# Patient Record
Sex: Male | Born: 1960 | Race: White | Hispanic: No | Marital: Single | Smoking: Current every day smoker
Health system: Southern US, Community
[De-identification: ages and names within clinical notes are randomized; demographics above are authoritative.]

## PROBLEM LIST (undated history)

## (undated) DIAGNOSIS — F209 Schizophrenia, unspecified: Secondary | ICD-10-CM

## (undated) DIAGNOSIS — F1010 Alcohol abuse, uncomplicated: Secondary | ICD-10-CM

## (undated) DIAGNOSIS — I10 Essential (primary) hypertension: Secondary | ICD-10-CM

## (undated) DIAGNOSIS — J449 Chronic obstructive pulmonary disease, unspecified: Secondary | ICD-10-CM

## (undated) HISTORY — PX: COLONOSCOPY: SHX174

---

## 2013-07-30 ENCOUNTER — Emergency Department: Payer: Self-pay | Admitting: Emergency Medicine

## 2013-08-03 LAB — COMPREHENSIVE METABOLIC PANEL
Albumin: 4 g/dL (ref 3.4–5.0)
Anion Gap: 9 (ref 7–16)
BUN: 7 mg/dL (ref 7–18)
Bilirubin,Total: 0.6 mg/dL (ref 0.2–1.0)
Calcium, Total: 8.5 mg/dL (ref 8.5–10.1)
EGFR (African American): >60
EGFR (Non-African Amer.): >60
Glucose: 97 mg/dL (ref 65–99)
Potassium: 3.7 mmol/L (ref 3.5–5.1)
SGOT(AST): 41 U/L — ABNORMAL HIGH (ref 15–37)
SGPT (ALT): 31 U/L (ref 12–78)
Sodium: 142 mmol/L (ref 136–145)
Total Protein: 7.7 g/dL (ref 6.4–8.2)

## 2013-08-03 LAB — CBC
HCT: 46.6 % (ref 40.0–52.0)
HGB: 16.1 g/dL (ref 13.0–18.0)
MCH: 32.4 pg (ref 26.0–34.0)
MCHC: 34.5 g/dL (ref 32.0–36.0)
MCV: 94 fL (ref 80–100)
Platelet: 239 10*3/uL (ref 150–440)
RBC: 4.95 10*6/uL (ref 4.40–5.90)
WBC: 8.2 10*3/uL (ref 3.8–10.6)

## 2013-08-03 LAB — DRUG SCREEN, URINE
Amphetamines, Ur Screen: NEGATIVE (ref ?–1000)
Barbiturates, Ur Screen: NEGATIVE (ref ?–200)
Benzodiazepine, Ur Scrn: POSITIVE (ref ?–200)
Cocaine Metabolite,Ur ~~LOC~~: NEGATIVE (ref ?–300)
Methadone, Ur Screen: NEGATIVE (ref ?–300)
Opiate, Ur Screen: NEGATIVE (ref ?–300)
Tricyclic, Ur Screen: NEGATIVE (ref ?–1000)

## 2013-08-03 LAB — ACETAMINOPHEN LEVEL: Acetaminophen: <2 ug/mL

## 2013-08-03 LAB — URINALYSIS, COMPLETE
Bilirubin,UR: NEGATIVE
Blood: NEGATIVE
Glucose,UR: NEGATIVE mg/dL (ref 0–75)
Ketone: NEGATIVE
Leukocyte Esterase: NEGATIVE
Nitrite: NEGATIVE
Ph: 5 (ref 4.5–8.0)
Protein: 100
Specific Gravity: 1.013 (ref 1.003–1.030)

## 2013-08-03 LAB — ETHANOL: Ethanol %: 0.308 % (ref 0.000–0.080)

## 2013-08-03 LAB — SALICYLATE LEVEL: Salicylates, Serum: 1.8 mg/dL

## 2013-08-04 ENCOUNTER — Inpatient Hospital Stay: Payer: Self-pay | Admitting: Psychiatry

## 2013-09-21 ENCOUNTER — Emergency Department: Payer: Self-pay | Admitting: Emergency Medicine

## 2013-09-29 ENCOUNTER — Ambulatory Visit: Payer: Self-pay | Admitting: Chiropractor

## 2014-11-07 ENCOUNTER — Emergency Department: Payer: Self-pay | Admitting: Emergency Medicine

## 2014-11-22 ENCOUNTER — Inpatient Hospital Stay
Admit: 2014-11-22 | Disposition: A | Discharge status: Home / Self Care | Payer: Self-pay | Attending: Internal Medicine | Admitting: Internal Medicine

## 2014-11-25 DIAGNOSIS — I517 Cardiomegaly: Secondary | ICD-10-CM

## 2014-11-25 LAB — CBC WITH DIFFERENTIAL/PLATELET
Basophil #: 0.1 10*3/uL (ref 0.0–0.1)
Basophil %: 1 %
Eosinophil #: 0.2 10*3/uL (ref 0.0–0.7)
Eosinophil %: 2.1 %
HCT: 32.2 % — ABNORMAL LOW (ref 40.0–52.0)
HGB: 10.8 g/dL — ABNORMAL LOW (ref 13.0–18.0)
LYMPHS PCT: 14.8 %
Lymphocyte #: 1.4 10*3/uL (ref 1.0–3.6)
MCH: 32.3 pg (ref 26.0–34.0)
MCHC: 33.5 g/dL (ref 32.0–36.0)
MCV: 96 fL (ref 80–100)
MONOS PCT: 9 %
Monocyte #: 0.8 x10 3/mm (ref 0.2–1.0)
Neutrophil #: 6.7 10*3/uL — ABNORMAL HIGH (ref 1.4–6.5)
Neutrophil %: 73.1 %
PLATELETS: 306 10*3/uL (ref 150–440)
RBC: 3.35 10*6/uL — ABNORMAL LOW (ref 4.40–5.90)
RDW: 14.6 % — ABNORMAL HIGH (ref 11.5–14.5)
WBC: 9.2 10*3/uL (ref 3.8–10.6)

## 2014-11-25 LAB — HEMOGLOBIN A1C: Hemoglobin A1C: 5.6 % (ref 4.2–6.3)

## 2014-11-25 LAB — BASIC METABOLIC PANEL
ANION GAP: 7 (ref 7–16)
BUN: 5 mg/dL — ABNORMAL LOW
CHLORIDE: 101 mmol/L
Calcium, Total: 7.8 mg/dL — ABNORMAL LOW
Co2: 27 mmol/L
Creatinine: 0.57 mg/dL — ABNORMAL LOW
EGFR (African American): >60
GLUCOSE: 130 mg/dL — AB
POTASSIUM: 3 mmol/L — AB
Sodium: 135 mmol/L

## 2014-11-25 LAB — POTASSIUM: POTASSIUM: 3.4 mmol/L — AB

## 2014-11-25 LAB — MAGNESIUM
MAGNESIUM: 2.1 mg/dL
Magnesium: 1.6 mg/dL — ABNORMAL LOW

## 2014-11-25 LAB — PHOSPHORUS: PHOSPHORUS: 3.2 mg/dL

## 2014-11-26 LAB — POTASSIUM: Potassium: 3.5 mmol/L

## 2014-11-27 LAB — PLATELET COUNT: Platelet: 346 10*3/uL (ref 150–440)

## 2014-11-27 LAB — VANCOMYCIN, TROUGH: Vancomycin, Trough: 9 ug/mL — ABNORMAL LOW

## 2014-11-28 LAB — BASIC METABOLIC PANEL WITH GFR
Anion Gap: 9
BUN: 10 mg/dL
Calcium, Total: 8.3 mg/dL — ABNORMAL LOW
Chloride: 97 mmol/L — ABNORMAL LOW
Co2: 30 mmol/L
Creatinine: 0.57 mg/dL — ABNORMAL LOW
EGFR (African American): >60
EGFR (Non-African Amer.): >60
Glucose: 133 mg/dL — ABNORMAL HIGH
Potassium: 3.9 mmol/L
Sodium: 136 mmol/L

## 2014-11-28 LAB — CBC WITH DIFFERENTIAL/PLATELET
Basophil #: 0 10*3/uL (ref 0.0–0.1)
Basophil %: 0.3 %
Eosinophil #: 0 10*3/uL (ref 0.0–0.7)
Eosinophil %: 0 %
HCT: 34.8 % — AB (ref 40.0–52.0)
HGB: 11.4 g/dL — AB (ref 13.0–18.0)
LYMPHS PCT: 9.2 %
Lymphocyte #: 0.9 10*3/uL — ABNORMAL LOW (ref 1.0–3.6)
MCH: 31.9 pg (ref 26.0–34.0)
MCHC: 32.9 g/dL (ref 32.0–36.0)
MCV: 97 fL (ref 80–100)
MONOS PCT: 0.7 %
Monocyte #: 0.1 x10 3/mm — ABNORMAL LOW (ref 0.2–1.0)
NEUTROS ABS: 8.8 10*3/uL — AB (ref 1.4–6.5)
NEUTROS PCT: 89.8 %
Platelet: 432 10*3/uL (ref 150–440)
RBC: 3.59 10*6/uL — ABNORMAL LOW (ref 4.40–5.90)
RDW: 14.7 % — ABNORMAL HIGH (ref 11.5–14.5)
WBC: 9.8 10*3/uL (ref 3.8–10.6)

## 2014-11-28 LAB — EXPECTORATED SPUTUM ASSESSMENT W GRAM STAIN, RFLX TO RESP C

## 2014-11-29 LAB — BRONCHIAL WASH CULTURE

## 2014-12-01 LAB — COMPREHENSIVE METABOLIC PANEL
ALBUMIN: 2.5 g/dL — AB
ALT: 28 U/L
ANION GAP: 8 (ref 7–16)
Alkaline Phosphatase: 335 U/L — ABNORMAL HIGH
BILIRUBIN TOTAL: 1.3 mg/dL — AB
BUN: 10 mg/dL
CHLORIDE: 103 mmol/L
Calcium, Total: 8 mg/dL — ABNORMAL LOW
Co2: 28 mmol/L
Creatinine: 0.61 mg/dL
EGFR (African American): >60
EGFR (Non-African Amer.): >60
GLUCOSE: 103 mg/dL — AB
POTASSIUM: 3.5 mmol/L
SGOT(AST): 37 U/L
SODIUM: 139 mmol/L
Total Protein: 6 g/dL — ABNORMAL LOW

## 2014-12-01 LAB — CBC WITH DIFFERENTIAL/PLATELET
Basophil #: 0.1 10*3/uL (ref 0.0–0.1)
Basophil %: 1.4 %
EOS ABS: 0 10*3/uL (ref 0.0–0.7)
Eosinophil %: 0.4 %
HCT: 30.4 % — ABNORMAL LOW (ref 40.0–52.0)
HGB: 10.3 g/dL — ABNORMAL LOW (ref 13.0–18.0)
Lymphocyte #: 1.7 10*3/uL (ref 1.0–3.6)
Lymphocyte %: 19.1 %
MCH: 32 pg (ref 26.0–34.0)
MCHC: 33.9 g/dL (ref 32.0–36.0)
MCV: 95 fL (ref 80–100)
Monocyte #: 0.6 x10 3/mm (ref 0.2–1.0)
Monocyte %: 6.1 %
NEUTROS PCT: 73 %
Neutrophil #: 6.7 10*3/uL — ABNORMAL HIGH (ref 1.4–6.5)
PLATELETS: 396 10*3/uL (ref 150–440)
RBC: 3.21 10*6/uL — AB (ref 4.40–5.90)
RDW: 14.5 % (ref 11.5–14.5)
WBC: 9.1 10*3/uL (ref 3.8–10.6)

## 2014-12-02 LAB — BASIC METABOLIC PANEL
Anion Gap: 11 (ref 7–16)
BUN: 10 mg/dL
CHLORIDE: 103 mmol/L
CREATININE: 0.57 mg/dL — AB
Calcium, Total: 8.4 mg/dL — ABNORMAL LOW
Co2: 25 mmol/L
Glucose: 93 mg/dL
POTASSIUM: 3.6 mmol/L
SODIUM: 139 mmol/L

## 2014-12-02 LAB — CBC WITH DIFFERENTIAL/PLATELET
BASOS PCT: 1 %
Basophil #: 0.2 10*3/uL — ABNORMAL HIGH (ref 0.0–0.1)
EOS PCT: 0.2 %
Eosinophil #: 0 10*3/uL (ref 0.0–0.7)
HCT: 32.2 % — ABNORMAL LOW (ref 40.0–52.0)
HGB: 10.7 g/dL — AB (ref 13.0–18.0)
LYMPHS PCT: 15.3 %
Lymphocyte #: 2.5 10*3/uL (ref 1.0–3.6)
MCH: 31.6 pg (ref 26.0–34.0)
MCHC: 33.2 g/dL (ref 32.0–36.0)
MCV: 95 fL (ref 80–100)
MONO ABS: 0.9 x10 3/mm (ref 0.2–1.0)
MONOS PCT: 5.3 %
NEUTROS PCT: 78.2 %
Neutrophil #: 12.7 10*3/uL — ABNORMAL HIGH (ref 1.4–6.5)
PLATELETS: 405 10*3/uL (ref 150–440)
RBC: 3.38 10*6/uL — AB (ref 4.40–5.90)
RDW: 14.8 % — ABNORMAL HIGH (ref 11.5–14.5)
WBC: 16.2 10*3/uL — AB (ref 3.8–10.6)

## 2014-12-02 LAB — URINALYSIS, COMPLETE
BACTERIA: NONE SEEN
BILIRUBIN, UR: NEGATIVE
BLOOD: NEGATIVE
Glucose,UR: NEGATIVE mg/dL (ref 0–75)
LEUKOCYTE ESTERASE: NEGATIVE
Nitrite: NEGATIVE
Ph: 5 (ref 4.5–8.0)
Protein: 100
RBC,UR: 1 /HPF (ref 0–5)
SPECIFIC GRAVITY: 1.028 (ref 1.003–1.030)
Squamous Epithelial: <1

## 2014-12-04 LAB — BASIC METABOLIC PANEL
Anion Gap: 7 (ref 7–16)
BUN: 16 mg/dL
CHLORIDE: 103 mmol/L
CO2: 30 mmol/L
Calcium, Total: 8.5 mg/dL — ABNORMAL LOW
Creatinine: 0.65 mg/dL
EGFR (African American): >60
EGFR (Non-African Amer.): >60
Glucose: 98 mg/dL
POTASSIUM: 3.2 mmol/L — AB
Sodium: 140 mmol/L

## 2014-12-06 ENCOUNTER — Inpatient Hospital Stay
Admit: 2014-12-06 | Disposition: A | Discharge status: Home / Self Care | Payer: Self-pay | Attending: Internal Medicine | Admitting: Internal Medicine

## 2014-12-06 LAB — URINALYSIS, COMPLETE
Bacteria: NONE SEEN
Bilirubin,UR: NEGATIVE
Blood: NEGATIVE
GLUCOSE, UR: NEGATIVE mg/dL (ref 0–75)
KETONE: NEGATIVE
Leukocyte Esterase: NEGATIVE
Nitrite: NEGATIVE
Ph: 6 (ref 4.5–8.0)
Protein: 30
RBC,UR: <1 /HPF (ref 0–5)
SPECIFIC GRAVITY: 1.025 (ref 1.003–1.030)
Squamous Epithelial: NONE SEEN
WBC UR: 2 /HPF (ref 0–5)

## 2014-12-06 LAB — COMPREHENSIVE METABOLIC PANEL
ALK PHOS: 181 U/L — AB
ALT: 35 U/L
Albumin: 3.1 g/dL — ABNORMAL LOW
Anion Gap: 8 (ref 7–16)
BILIRUBIN TOTAL: 0.7 mg/dL
BUN: 12 mg/dL
CALCIUM: 8.6 mg/dL — AB
Chloride: 104 mmol/L
Co2: 27 mmol/L
Creatinine: 0.66 mg/dL
Glucose: 101 mg/dL — ABNORMAL HIGH
Potassium: 3.3 mmol/L — ABNORMAL LOW
SGOT(AST): 30 U/L
Sodium: 139 mmol/L
Total Protein: 6.6 g/dL

## 2014-12-06 LAB — ETHANOL

## 2014-12-06 LAB — PROTIME-INR
INR: 0.9
Prothrombin Time: 12.7 secs

## 2014-12-06 LAB — CBC
HCT: 32.4 % — ABNORMAL LOW (ref 40.0–52.0)
HGB: 10.9 g/dL — ABNORMAL LOW (ref 13.0–18.0)
MCH: 32.1 pg (ref 26.0–34.0)
MCHC: 33.5 g/dL (ref 32.0–36.0)
MCV: 96 fL (ref 80–100)
Platelet: 386 10*3/uL (ref 150–440)
RBC: 3.38 10*6/uL — AB (ref 4.40–5.90)
RDW: 15.7 % — ABNORMAL HIGH (ref 11.5–14.5)
WBC: 15.7 10*3/uL — ABNORMAL HIGH (ref 3.8–10.6)

## 2014-12-06 LAB — LACTIC ACID, PLASMA: LACTIC ACID, VENOUS: 1.2 mmol/L

## 2014-12-06 LAB — TROPONIN I: Troponin-I: <0.03 ng/mL

## 2014-12-06 LAB — CK TOTAL AND CKMB (NOT AT ARMC)
CK, Total: 17 U/L — ABNORMAL LOW
CK-MB: 1.2 ng/mL

## 2014-12-06 LAB — APTT: Activated PTT: 31.9 secs (ref 23.6–35.9)

## 2014-12-07 LAB — CBC WITH DIFFERENTIAL/PLATELET
BASOS ABS: 0.1 10*3/uL (ref 0.0–0.1)
BASOS PCT: 1 %
EOS ABS: 0.4 10*3/uL (ref 0.0–0.7)
Eosinophil %: 3.7 %
HCT: 32.1 % — AB (ref 40.0–52.0)
HGB: 10.4 g/dL — ABNORMAL LOW (ref 13.0–18.0)
LYMPHS ABS: 2.2 10*3/uL (ref 1.0–3.6)
LYMPHS PCT: 19.1 %
MCH: 31.3 pg (ref 26.0–34.0)
MCHC: 32.3 g/dL (ref 32.0–36.0)
MCV: 97 fL (ref 80–100)
Monocyte #: 0.7 x10 3/mm (ref 0.2–1.0)
Monocyte %: 6.1 %
Neutrophil #: 8.1 10*3/uL — ABNORMAL HIGH (ref 1.4–6.5)
Neutrophil %: 70.1 %
Platelet: 358 10*3/uL (ref 150–440)
RBC: 3.31 10*6/uL — ABNORMAL LOW (ref 4.40–5.90)
RDW: 15.2 % — ABNORMAL HIGH (ref 11.5–14.5)
WBC: 11.6 10*3/uL — ABNORMAL HIGH (ref 3.8–10.6)

## 2014-12-07 LAB — VANCOMYCIN, TROUGH: Vancomycin, Trough: 11 ug/mL

## 2014-12-07 LAB — BASIC METABOLIC PANEL
Anion Gap: 7 (ref 7–16)
BUN: 8 mg/dL
Calcium, Total: 7.9 mg/dL — ABNORMAL LOW
Chloride: 105 mmol/L
Co2: 26 mmol/L
Creatinine: 0.6 mg/dL — ABNORMAL LOW
EGFR (African American): >60
EGFR (Non-African Amer.): >60
Glucose: 106 mg/dL — ABNORMAL HIGH
Potassium: 3.3 mmol/L — ABNORMAL LOW
Sodium: 138 mmol/L

## 2014-12-11 LAB — CULTURE, BLOOD (SINGLE)

## 2014-12-24 NOTE — H&P (Signed)
PATIENT NAME:  Jack Lowe, Jack Lowe MR#:  [id number] DATE OF BIRTH:  05/26/1961  DATE OF ADMISSION:  08/04/2013 DATE OF EVALUATION: 08/05/2013  DATE OF DISCHARGE:  08/05/2013  REFERRING PHYSICIAN: Conni Slipper, MD ATTENDING PHYSICIAN: Ilena Dieckman B. Bary Leriche, MD  IDENTIFYING DATA: Mr. Advincula is a 54 year old male with episodic drinking.   CHIEF COMPLAINT: "I want to go home."   HISTORY OF PRESENT ILLNESS: Mr. Marchena is gainfully employed and in a relationship, but over the Thanksgiving holidays, he was drinking too much. When he started vomiting and his fiancee noticed some blood in his vomit, she brought him to the Emergency Room. His blood alcohol level was over 300. Psychiatry was asked for a consultation. Dr. Gretel Acre evaluated the patient in the Emergency Room, and she decided to put him on involuntary inpatient psychiatric commitment and admit to psychiatry. She started him on a CIWA protocol and prescribed standing doses of Librium. The following day, I met with the patient. He is cool and collected. He does not have any symptoms of alcohol withdrawal. He denies any symptoms of depression, anxiety, or psychosis. He denies, other than alcohol, substance use. 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 He is an episodic drinker but has never missed work because of drinking. He did go to one AA meeting in the area. He is originally from [place] but relocated here for work. He works with men who attend multiple [place] meetings, and they already discussed going to meetings together. He does not have a sponsor yet. He denies, other than alcohol, substance use. It is unclear why the patient was placed on involuntary commitment, but he reported symptoms of depression, hopelessness, helplessness. He denied suicidal ideation. He reported at the time of admission that he was unable to care for himself. Today, the patient states that when he  drinks, he gets depressed but never suicidal. He is sober now and again denies symptoms of depression and obviously is able to care for himself, as he is working.   PAST PSYCHIATRIC HISTORY: He has never been admitted to a psychiatric hospital, never seen a psychiatrist except for 2 detoxes that were done in [place] and [facility] in [place]. He also enrolled in a long-term rehab program but left AMA after 12 months when he was supposed to stay there for 18 months. Following this treatment, he maintained sobriety for 1 year.   FAMILY PSYCHIATRIC HISTORY: His uncle committed suicide. He was an alcoholic. His father was an alcoholic.   PAST MEDICAL HISTORY: None reported.   ALLERGIES: No known drug allergies.   MEDICATIONS ON ADMISSION: None.   SOCIAL HISTORY: The patient is divorced. He has 2 adult children. The 66 year old daughter has a baby, and the 63 year old lives with the mother. He did have a DUI in 2000. He is not in  legal trouble. He moved from [place] to [place] for work and works for [employer]. He lives here with a girlfriend who is supportive.   REVIEW OF SYSTEMS:    CONSTITUTIONAL: No fevers or chills. No weight changes.  EYES: No double or blurred vision.  ENT: No hearing loss.  RESPIRATORY: No shortness of breath or cough.  CARDIOVASCULAR: No chest pain or orthopnea.  GASTROINTESTINAL: No abdominal pain, nausea, vomiting, or diarrhea, but the patient did have some vomiting at the time of his initial visit to the Emergency Room. This has resolved.  GENITOURINARY:  No incontinence or frequency.  ENDOCRINE: No heat or cold intolerance.  LYMPHATIC: No anemia or easy bruising.  INTEGUMENTARY: No acne or rash.  MUSCULOSKELETAL: No muscle or joint pain.  NEUROLOGIC: No tingling or weakness.  PSYCHIATRIC: See history of present illness for details.   PHYSICAL EXAMINATION: VITAL SIGNS: Blood pressure 138/89, pulse 108, respirations 20, temperature 97.8.   GENERAL: This is a well-developed male in no acute distress.  HEENT: The pupils are equal, round, and reactive to light. Sclerae are anicteric.  NECK: Supple. No thyromegaly.  LUNGS: Clear to auscultation. No dullness to percussion.  HEART: Regular rhythm and rate. No murmurs, rubs, or gallops.  ABDOMEN: Soft, nontender, nondistended. Positive bowel sounds.  MUSCULOSKELETAL: Normal muscle strength in all extremities.  SKIN: No rashes or bruises.  LYMPHATIC: No cervical adenopathy.  NEUROLOGIC: Cranial nerves II through XII are intact.   LABORATORY DATA: Chemistries are within normal limits. Blood alcohol level is 0.308. LFTs within normal limits except for AST of 41. Urine tox screen positive for benzodiazepines. CBC within normal limits. Urinalysis is not suggestive of urinary tract infection. Serum acetaminophen and salicylates are low.   EKG: Sinus tachycardia, incomplete right branch block.   MENTAL STATUS EXAMINATION: The patient is alert and oriented to person, place, time, and situation. He is pleasant, polite, and cooperative. He is well groomed and casually dressed. He maintains good eye contact. His speech is of normal rhythm, rate, and volume. His mood is fine with full affect. Thought process is logical and goal oriented. Thought content: He denies suicidal or homicidal ideation. There are no delusions or paranoia. There are no auditory or visual hallucinations. His cognition is grossly intact. His insight and judgment are intact.   SUICIDE RISK ASSESSMENT: This is a patient with a history of alcoholism, who denies any symptoms of depression, anxiety, or psychosis, who is an episodic drinker and came to the hospital intoxicated. Even during intoxication, he did not voice any thoughts of hurting himself or others. He unfortunately is not interested in substance abuse treatment as a resident, as he has a job he would lose. He is ready to start outpatient program.   DIAGNOSES: AXIS I:  Alcohol dependence, alcohol intoxication, benzodiazepine abuse.  AXIS II: Deferred.  AXIS III: Alcoholic gastritis.  AXIS IV: Substance abuse.  AXIS V: Global Assessment of Functioning 55.   HOSPITAL COURSE:  The patient was admitted to [facility] for safety, stabilization, and medication management. He was initially placed on suicide precautions and was closely monitored for any unsafe behaviors. He underwent full psychiatric and risk assessment. He received pharmacotherapy, individual and group psychotherapy, substance abuse counseling, and support from therapeutic milieu.   1.  Alcohol detox: Admitting psychiatrist started the patient on CIWA protocol. He did not receive any Ativan per CIWA or Librium that was a standing order. He declined. He most likely does not require alcohol detox.  2.  Substance abuse treatment: The patient declines residential treatment. He is agreeable to follow up with [facility] program.   3.  Disposition: He will be discharged to home with his girlfriend. He will follow up with [facility].   MEDICATIONS AT THE TIME OF DISCHARGE: None.   ____________________________ Wardell Honour. Bary Leriche, MD jbp:jcm D: 08/05/2013 17:01:55 ET T: 08/05/2013 18:44:17 ET JOB#: [id number]  cc: Mackynzie Woolford B. Bary Leriche, MD, <Dictator> Clovis Fredrickson MD ELECTRONICALLY SIGNED 08/05/2013 20:18

## 2014-12-24 NOTE — Consult Note (Signed)
PATIENT NAME:  Jack Lowe, Jack Lowe MR#:  [id number] DATE OF BIRTH:  [birth year]  DATE OF CONSULTATION:  08/04/2013  REFERRING PHYSICIAN:  Dorothea GlassmanPaul Malinda, MD CONSULTING PHYSICIAN:  Ardeen FillersUzma S. Garnetta BuddyFaheem, MD  REASON FOR CONSULTATION: Alcohol intoxication.   HISTORY OF PRESENT ILLNESS: The patient is a 54 year old divorced male who presented to the ER intoxicated. His blood alcohol level was more than 300. He stayed in the ED and was started on medications. The patient reported during my interview that he is going through too much at this time. He has been drinking too much alcohol including a combination of beer and liquor for the past 3 days. He was brought to the hospital by the ambulance. The patient reported that he has been falling 1 to 2 times for the past couple of days. He reported that he currently lives by himself as he moved to [place] in June and is working through [employer]. The patient reported that he feels depressed, hopeless, and helpless at this time. He currently denied having any suicidal ideation or plan. He reported that he has been having shakes and has been passing out frequently. He reported that he is unable to care for himself. The patient appeared to have a flushed face at this time.   PAST PSYCHIATRIC HISTORY: The patient reported that he has never been admitted to a psychiatric hospital in the past. However, he has been through detox 1 to 2 times, in [place] and [place], [place]. He also completed a 1 year rehab program, but left out AMA as it was actually an 18 month rehabilitation program. He remained sober for approximately 1 year but relapsed after that. The patient reported that he does not use any other drugs or alcohol at this time. He smokes 1 pack of cigarettes per day.   MEDICAL HISTORY: The patient currently denied having any medical problems.   ALLERGIES: No known drug allergies.   FAMILY HISTORY: The patient reported that he has history of alcoholism in  his father.   CURRENT MEDICATIONS: None.   SOCIAL HISTORY: The patient is currently divorced. He has 2 children, ages 823 and [id number], and has good relationship with them. He has history of DWI in 2000. He denies any pending legal charges. He reported that he has a girlfriend who supports him. 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 He stated that his family lives in [place]. He has 2 brothers and 2 sisters.   REVIEW OF SYSTEMS: CONSTITUTIONAL: Denies any fever. Having weakness.  EYES: No double or blurred vision, but his eyes appeared red. His face appears flushed at this time.  ENT: No tinnitus, ear pain, or hearing loss.  RESPIRATORY: No cough or wheezing.  CARDIOVASCULAR: His pulse was elevated at this time. No palpitations or chest pain.  GASTROINTESTINAL: No nausea, vomiting, or diarrhea.  GENITOURINARY: No dysuria or hematuria.  ENDOCRINE: No polyuria or nocturia.  INTEGUMENTARY: No acne or rash.  NEUROLOGICAL: No numbness weakness noted.  VITAL SIGNS: Temperature 98.5, pulse 102, respirations 18, blood pressure 143/71.  LABORATORY DATA: Glucose 97, BUN 7, creatinine 0.70, sodium 142, potassium 3.7, chloride 105, bicarbonate 28, anion gap 9, osmolality 281, calcium 8.5. Blood alcohol level was 308 at the time of admission. Protein 7.7, albumin 4, bilirubin 0.6, alkaline phosphatase 116, AST 41, ALT 31. Amphetamines negative. UDS positive for benzodiazepines. WBC 8.2, hemoglobin 16.1, hematocrit 46.6, MCV 94, and RDW 15.8.  MENTAL STATUS EXAMINATION: The patient is a moderately built male who appeared with a flushed face. His speech was low in tone and volume. Mood was depressed and anxious. Affect was congruent. Thought process was logical. Thought content was nondelusional. He demonstrated poor insight and judgment regarding his alcohol use. He denied having any hallucinations at this time. His memory appeared intact.    DIAGNOSTIC IMPRESSION: AXIS I:  1.  Alcohol intoxication. 2.  Alcohol dependence.  3.  Alcohol-induced mood disorder.  AXIS II: None.  AXIS III: Alcohol use.   TREATMENT PLAN:  1.  The patient will be placed on involuntary commitment at this time as he appears a safety risk to himself as he has poor insight about his use of alcohol.  2.  He will be started on Librium 25 mg p.o. t.i.d. for the next 3 days.  3.  He will be referred to [facility] for alcohol detox and rehabilitation once he becomes medically stable.   Thank you for allowing me to participate in the care of this patient.  ____________________________ Ardeen Fillers. Garnetta Buddy, MD usf:sb D: 08/04/2013 10:51:10 ET T: 08/04/2013 11:29:22 ET JOB#: [id number]  cc: Ardeen Fillers. Garnetta Buddy, MD, <Dictator> Rhunette Croft MD ELECTRONICALLY SIGNED 08/06/2013 11:15

## 2015-01-02 NOTE — Discharge Summary (Signed)
PATIENT NAME:  Jack Lowe, Jack E MR#:  [id number] DATE OF BIRTH:  [birth year]  DATE OF ADMISSION:  11/22/2014 DATE OF DISCHARGE:  12/04/2014  Please refer to the interim summary already dictated 11/29/2013 by Dr.  Delfino LovettVipul Shah. This will cover from March 30 through April 02.   ADMISSION DIAGNOSIS: Delirium tremens with alcohol withdrawal and acute encephalopathy.   DISCHARGE DIAGNOSES: 1. Acute respiratory failure due to methicillin-susceptible Staphylococcus aureus pneumonia.  2. Delirium tremens, resolved.  3. Sinus tachycardia, resolved.  4. Nondisplaced avulsion fracture of the left greater tuberosity of the humerus.   CONSULTATIONS:  1. Dr. Belia HemanKasa.  2. Orthopedic, Dr. Welton FlakesKhan.  LABORATORIES AT DISCHARGE: Sodium 140, potassium 3.2, chloride 103, bicarbonate 30, BUN 16, creatinine 0.65, glucose 98.   DISCHARGE PHYSICAL EXAMINATION: VITAL SIGNS: Afebrile with temperature 98.6, pulse is 86, respirations 20, blood pressure 120/76 and 93% on room air.  GENERAL: The patient is alert, oriented. Not in acute distress.  CARDIOVASCULAR: Regular rate and rhythm. No murmurs, gallops or rubs. PMI was not displaced.  LUNGS: Clear to auscultation without crackles, rhonchi or wheezing. No dullness to percussion.  ABDOMEN: Bowel sounds present. Nontender, nondistended. No hepatosplenomegaly. EXTREMITIES: No clubbing, cyanosis, edema.   HOSPITAL COURSE: This is a 54 year old male with a history of alcohol abuse, who was admitted with hallucinations on 11/22/2014, found to have delirium tremens. For further details, please refer to the H and P as well as in the interim summary dictated on March 29.   1. Acute respiratory failure due to methicillin-susceptible Staphylococcus aureus pneumonia. The patient's sputum cultures were growing out methicillin-susceptible Staphylococcus aureus status post bronchoscopy. He was extubated on 11/27/2014. The patient is doing well from a respiratory standpoint. He no longer  requires oxygen. His chest x-ray did show improvement. His echocardiogram showed normal ejection fraction.  2. Delirium tremens. The patient was admitted with delirium tremens and hallucinations. He was in the ICU for several days. He was on Precedex. This obviously was weaned off and he was then transitioned to Valium which has been discontinued. He was initially placed on CIWA. He has gone through his withdrawals.  3. Nondisplaced avulsion fracture of the left greater tuberosity of humerus. Appreciate orthopedics input. He has a sling for comfort and pain medications p.r.n.  He will need follow-up with orthopedics for repeat imaging to insure healing.  4. Weakness. The patient will have home health at discharge.  5. Elevated liver function tests. An ultrasound of the abdomen was normal. He had elevated liver function tests due to alcohol.   DISCHARGE MEDICATIONS: 1. Metoprolol 25 mg b.i.d.  2. Nicotine patch 10 mg inhalation every 2 hours p.r.n. craving for nicotine.  3. Oxycodone 5 mg every 6 hours p.r.n., #30.   Discharge home health, physical therapy and nurse for assessment in gait.   DISCHARGE DIET: Regular diet.   DISCHARGE ACTIVITY: As tolerated.   DISCHARGE FOLLOWUP: The patient will need to follow up with his family MD in 2 weeks as well as orthopedic surgery.   The patient was stable for discharge.  TIME SPENT: 45 minutes    ____________________________ Zyanna Leisinger P. Juliene PinaMody, MD spm:TT D: 12/04/2014 13:51:22 ET T: 12/04/2014 15:23:43 ET JOB#: [id number]  cc: Tyri Elmore P. Juliene PinaMody, MD, <Dictator> Janyth ContesSITAL P Elliyah Liszewski MD ELECTRONICALLY SIGNED 12/04/2014 20:49

## 2015-01-02 NOTE — H&P (Signed)
PATIENT NAME:  Jack Lowe, BAGOT MR#:  [id number] DATE OF BIRTH:  16-Mar-1961  DATE OF ADMISSION:  11/22/2014  PRIMARY CARE PHYSICIAN:  Unknown.  CHIEF COMPLAINT: Brought in with hallucinations.   HISTORY OF PRESENT ILLNESS: This is a 54 year old man who is unable to give me any history whatsoever. As per the ER physician he was talking, mumbling his words, looked like he was going through withdrawal. I spoke with Tammy Apple who has the same home phone number as him.  The patient does live with her. She said he recently fractured his shoulder. The pain medications did not last. He needed to work so he started drinking alcohol for the past 2 weeks. He has not been eating very well. He stopped drinking a day or so ago, then started hallucinating.  Tammy tried to give him a few beers today to try to settle things down, but that did not help. Things got worse and was brought in for further evaluation.   When I saw the patient, I sternal rubbed him. He did moan and thrash around in the bed, but unable to give me any history.   PAST MEDICAL HISTORY: As per Tammy, included gastroesophageal reflux disease.   PAST SURGICAL HISTORY: None.   ALLERGIES: KETOROLAC.   MEDICATIONS: Include Prilosec on a daily basis.   SOCIAL HISTORY: Smokes 1 pack per day. Positive for alcohol. No drug use. Works in a Scientist, water quality.   FAMILY HISTORY: Father died of cancer. Mother, unknown medical history.  REVIEW OF SYSTEMS: Unable to obtain secondary to altered mental status.   PHYSICAL EXAMINATION: VITAL SIGNS: Temperature 98, pulse 107, respirations 24, blood pressure 163/87, pulse oximetry 92% on room air.  GENERAL: No respiratory distress, thrashing around in the bed.  EYES: Conjunctivae and lids normal. Pupils equal, round, and reactive to light. Unable to test extraocular muscles.  EARS, NOSE, MOUTH, AND THROAT: Nasal mucosa, no erythema. Throat, unable to be examined because he did not open his  mouth.  NECK: No JVD. No bruits. No lymphadenopathy. No thyromegaly. No thyroid nodules palpated.  RESPIRATORY:  Lungs clear to auscultation. No use of accessory muscles to breathe. No rhonchi, rales, or wheeze heard.  CARDIOVASCULAR: S1, S2 normal. No gallops, rubs, or murmurs heard.  ABDOMEN: Soft, nontender. No organomegaly/splenomegaly. Normoactive bowel sounds. No masses felt.  LYMPHATIC: No lymph nodes in the neck.  MUSCULOSKELETAL: No clubbing, edema, cyanosis.  SKIN: No ulcers or lesions seen.  NEUROLOGIC: Difficult to test, but he is moving all extremities on his own, just not purposely to my command.  PSYCHIATRIC: Unable to test at this time.   LABORATORY AND RADIOLOGICAL DATA: Urinalysis negative. Urine toxicology negative. Magnesium 1.8.  Salicylates less than 4, acetaminophen less than 10. Ethanol less than 5.  Glucose 101, BUN 10, creatinine 0.77, sodium 121, potassium 2.7, chloride 77, CO2 of 33, calcium 8.4, total bilirubin 2.1, alkaline phosphatase 239, AST 95, ALT 44. White blood cell count 9.2, H and H, 13.4 and 38.2, platelet count of 251,000.   ASSESSMENT AND PLAN: 1.  Delirium tremens with alcohol withdrawal and acute encephalopathy. At this point since he is unresponsive and unable to communicate, I have to admit him to the critical care unit for further monitoring. I will put on CIWA protocol, Precedex drip. Continue to monitor for worsening situation. This may get worse before it does get better.  2.  Hypokalemia. We will replace potassium intravenous and then intravenous fluids and check a magnesium, replace that if  low also.  3.  Hyponatremia, could be another reason for the patient's altered mental status, but likely this is secondary to the alcohol abuse. We will give intravenous fluid hydration and continue to monitor.  4.  Elevated liver function tests, likely secondary to alcohol abuse. Will check a hepatitis profile in the a.m. and an ultrasound of the abdomen and  recheck liver function tests after hydration.  TIME SPENT ON ADMISSION: 50 minutes. The patient will be admitted to the critical care unit.   ____________________________ Herschell Dimesichard J. Renae GlossWieting, MD rjw:LT D: 11/22/2014 17:14:26 ET T: 11/22/2014 17:30:43 ET JOB#: [id number]  cc: Herschell Dimesichard J. Renae GlossWieting, MD, <Dictator> Salley ScarletICHARD J Traci Gafford MD ELECTRONICALLY SIGNED 11/23/2014 17:30

## 2015-01-02 NOTE — H&P (Addendum)
PATIENT NAME:  Jack Lowe, Jack Lowe MR#:  [id number] DATE OF BIRTH:  November 29, 1960  DATE OF ADMISSION:  12/06/2014  PRIMARY DOCTOR: None.  EMERGENCY ROOM PHYSICIAN: Eryka A. Inocencio HomesGayle, MD  CHIEF COMPLAINT: Shortness of breath.   HISTORY OF PRESENT ILLNESS: The patient is a 54 year old male who was recently discharged on April 02 to home, come back because of shortness of breath, cough, and chest pain and left shoulder pain. The patient has been here from March 21 to April 2 and he has a significant hospital stay for MRSA pneumonia and acute respiratory failure requiring intubation. He also had delirium tremens and nondisplaced left greater tuberosity fracture of humerus at that time. The patient was discharged on 2nd and he went home, and the patient felt short of breath with cough and pleuritic chest pain. The patient found to have a new lingular pneumonia with elevated white count 15.7. He also was tachycardic, heart rate up to 117 and tachypneic. Concerning this, the patient is called for admission. The patient is upset because he wanted to go out and smoke and he complains of hoarseness of the voice that started after extubation.   PAST MEDICAL HISTORY: Significant for: 1.  GERD. 2.  A history of heavy alcohol abuse. 3.  Recent hospital stay for MRSA pneumonia, had bronchoscopy.   PAST SURGICAL HISTORY: None.   ALLERGIES: TORADOL.   SOCIAL HISTORY: Smokes 1 pack per day. No plans to quit. Alcohol abuse and the patient does not binge drink, and plans to quit.   FAMILY HISTORY: Father had cancer.   MEDICATIONS: Include: 1.  Metoprolol tartrate 25 mg p.o. b.i.d.  2.  Nicotine 10 mg 1 puff every 2 hours for nicotine craving. 3.  Oxycodone 5 mg every 6 hours as needed for pain.    REVIEW OF SYSTEMS: CONSTITUTIONAL: Has hoarseness of voice. No fever. Does have some trouble breathing.  EYES: No blurred vision.  ENT: No tinnitus. No epistaxis. No  turbinate hypertrophy,no oropharyngeal  erythema RESPIRATORY: Does have some cough, shortness of breath. No using accessory muscles of respiration.  CARDIOVASCULAR: Pleuritic chest pain present. No orthopnea or pedal edema.  GASTROINTESTINAL: No nausea. No vomiting. No abdominal pain.  GENITOURINARY: No dysuria.  ENDOCRINE: No polyuria or nocturia.  HEMATOLOGIC: No anemia or easy bruising.  INTEGUMENTARY: No skin rashes.  MUSCULOSKELETAL: Has left shoulder sling present. Has nondisplaced humerus fracture.  PSYCHIATRIC: Does have anxiety.  PHYSICAL EXAMINATION:  VITAL SIGNS: Temperature 98.2, heart rate 117, blood pressure 150/90, saturation is 96% on room air.  GENERAL: He is alert, awake, oriented, well-developed, well-nourished male slightly in distress because of left shoulder pain and also wanting to smoke cigarette.  HEAD: Normocephalic, atraumatic.  EYES: Pupils equal, reacting to light. No conjunctival pallor. No icterus. Extraocular movements intact. NOSE: No nasal lesions. No drainage.  EARS: No drainage. No external lesions. MOUTH: No lesions noted.  NECK: Supple. No JVD. No carotid bruit. Normal range of motion without pain.  RESPIRATORY: Bilateral breath sounds present, clear to auscultation. No wheeze. No rales.  CARDIOVASCULAR: S1, S2 regular, tachycardic. The patient's pulses equal at carotid, femoral, dorsalis. No peripheral edema.  ABDOMEN: Soft, nontender, nondistended. Bowel sounds present.  MUSCULOSKELETAL: The patient does have left shoulder sling due to a greater tuberosity fracture.  SKIN: Inspection is normal.  VASCULAR: Good pedal pulse.  NEUROLOGIC: Cranial nerves II-XII intact. DTR's 2+ bilaterally. Power of lower extremities 5/5, and right upper extremity 5/5. Strength is not checked on the left upper extremity because  of nondisplaced fracture. PSYCHIATRIC: The patient is anxious and agitated and wanting to smoke.   LABORATORY DATA: WBC 15.7, hemoglobin 10.9, hematocrit 32.4, platelets 86,000.   Electrolytes: Sodium 139, potassium 3.3, chloride 104, bicarbonate 27, BUN 12, creatinine 0.68, glucose 101.  Chest x-ray shows lingular pneumonia. UA is clear. EKG shows sinus tachycardia with 116 beats per minute. No ST-T changes.   ASSESSMENT AND PLAN:  14.  A 54 year old male with healthcare-associated pneumonia with elevated white count, tachypnea, tachycardia, and also with a white count as I mentioned. The patient is admitted to medical service. Start him on vancomycin and Zosyn and oxygen and follow cultures.  2.  Left shoulder pain secondary to greater tuberosity fracture, nondisplaced, seen by ortho last admission and sling is there for comport. Continue pain medicine with tramadol and oxycodone as needed. The patient needs to follow up with them after discharge to ensure healing.  3.  The patient has history of EtOH abuse now, not in delirium tremens.  4.  History of tobacco abuse. The patient will get Nicotrol Inhaler.   TIME SPENT: Fifty-five minutes.    ____________________________ Katha Hamming, MD sk:TM D: 12/06/2014 19:36:38 ET T: 12/06/2014 20:27:27 ET JOB#: [id number]  cc: Katha Hamming, MD, <Dictator> Katha Hamming MD ELECTRONICALLY SIGNED 01/04/2015 12:57

## 2015-01-02 NOTE — Consult Note (Signed)
Brief Consult Note: Diagnosis: L shoulder Greater tuberosity fracture.   Comments: Jack Lowe admitted to ICU for ETOH withdrawal with respiratory failure, DTs, extubated today. L shoulder pain noted previously on admission, and shoulder xrays obtained. Patient was nonverbal on my exam, but did nod occasionally to questions. He affirmed L shoulder pain.   PE:  Awake, difficult to direct TTP over L GT Able to passively abduct shoulder and passively forward flex shoulder 2/5 elbow flexion, 3/5 grip strength Unable to assess sensation BCR  Xray R shoulder - no fracture or dislocation Xray L shoulder - Nondisplaced greater tuberosity fracture  Impression - nondisplaced fracture of greater tuberosity Plan: Sling for comfort Elbow and wrist motion encouraged, sling removal daily for motion when able No active shoulder flexion or external rotation followup with Dr. Lutricia HorsfallHooton after discharge for repeat imaging to ensure healing please reconsult with questions.  Electronic Signatures: Andee PolesKahn, Infinity Jeffords D (MD)  (Signed 26-Mar-16 22:07)  Authored: Brief Consult Note   Last Updated: 26-Mar-16 22:07 by Deatra JamesKahn, Leone Mobley D (MD)

## 2015-01-02 NOTE — Discharge Summary (Signed)
PATIENT NAME:  Jack Lowe, Jack E MR#:  [id number] DATE OF BIRTH:  [birth year]  DATE OF ADMISSION:  12/06/2014 DATE OF DISCHARGE:  12/08/2014  PRIMARY CARE PHYSICIAN:  [facility].   FINAL DIAGNOSES: 1. Clinical sepsis.  2. Health care acquired pneumonia.  3. Chronic obstructive pulmonary disease exacerbation.  4. Tobacco abuse.  5. Recent fracture.  6. Recent admission for alcohol abuse and withdrawal and delirium tremens.   HOSPITAL COURSE: The patient was admitted 12/06/2014, and discharged 12/08/2014. The patient was admitted with shortness of breath, elevated white count, was started on aggressive antibiotics. Laboratory and radiological data during the hospital course included blood cultures that were negative. Ethanol less than 5, glucose 101, BUN 12, creatinine 0.66, sodium 139, potassium 3.3, chloride 104, CO2 of 27, calcium 8.6. Liver function tests: Total bilirubin 0.7, alkaline phosphatase 181, ALT 35, AST 30, total protein 6.6, albumin 3.1. White blood cell count 15.7, hemoglobin and hematocrit 10.9 and 32.4, platelet count of 386,000. CPK 17. PT, INR, and PTT normal range. Lactic acid 1.2. Troponin negative. EKG: Sinus tachycardia. Chest x-ray showed lingular pneumonia. Urinalysis negative. White count upon discharge 11.6.   HOSPITAL COURSE PER PROBLEM LIST:  1. Clinical sepsis with leukocytosis and tachycardia.  Pneumonia seen on chest x-ray. This was present on admission.  The patient was started on aggressive antibiotics.  2. Healthcare acquired pneumonia. The patient had a recent hospitalization and was treated for H. flu and methicillin sensitive Staph aureus pneumonia during the hospital course.   I am unclear if this lingular pneumonia is new or previous from last time, but I will treat it anyway since he came in with respiratory symptoms. 3. COPD exacerbation.  When I saw the patient on April 5, he was wheezing. I think the issue here is he went home, started smoking, and  then came back with bronchospasm.  I started high-dose steroids.  Upon discharge, I will give a prednisone taper and I prescribed a Flovent and albuterol inhaler also. 4. Tobacco abuse.  Advised against smoking.  Nicotrol inhaler ordered.  5. Recent fracture. He is in a sling on his left arm. I did prescribe a small amount of Percocet. 6. Recent hospitalization with delirium tremens.  He was treating his fracture previously with alcohol, went into severe alcohol withdrawal during the last hospitalization requiring intubation, and then extubation. The patient had no signs of withdrawal on this hospitalization.   TIME SPENT ON DISCHARGE:  Thirty-five minutes.   MEDICATIONS ON DISCHARGE INCLUDED:  Percocet 5/325 one tablet every 6 hours as needed for moderate pain, metoprolol 25 mg twice a day, Levaquin 500 milligrams once a day for 8 more days, nicotine inhaler 1 inhalation every 1 to 2 hours, Flovent HFA 2 puffs twice a day, albuterol CFC 1 puff 4 times a day as needed for shortness breath, prednisone taper 5 mg 4 tablets day one, 3 tablets day two, 2 tablets day three, 1 tablet day four and five, then stop.     ____________________________ Herschell Dimesichard J. Renae GlossWieting, MD rjw:tr D: 12/08/2014 14:25:11 ET T: 12/08/2014 17:47:38 ET JOB#: [id number]  cc: Herschell Dimesichard J. Renae GlossWieting, MD, <Dictator> [facility] Salley ScarletICHARD J Haydan Mansouri MD ELECTRONICALLY SIGNED 12/09/2014 15:43

## 2015-04-06 LAB — CBC WITH DIFFERENTIAL/PLATELET
BASOS PCT: 0.4 %
Basophil #: 0 10*3/uL (ref 0.0–0.1)
EOS ABS: 0.2 10*3/uL (ref 0.0–0.7)
Eosinophil %: 1.8 %
HCT: 37.8 % — ABNORMAL LOW (ref 40.0–52.0)
HGB: 12.2 g/dL — ABNORMAL LOW (ref 13.0–18.0)
LYMPHS PCT: 19.8 %
Lymphocyte #: 2.3 10*3/uL (ref 1.0–3.6)
MCH: 31.8 pg (ref 26.0–34.0)
MCHC: 32.3 g/dL (ref 32.0–36.0)
MCV: 99 fL (ref 80–100)
MONOS PCT: 13 %
Monocyte #: 1.5 x10 3/mm — ABNORMAL HIGH (ref 0.2–1.0)
NEUTROS ABS: 7.6 10*3/uL — AB (ref 1.4–6.5)
NEUTROS PCT: 65 %
PLATELETS: 282 10*3/uL (ref 150–440)
RBC: 3.83 10*6/uL — ABNORMAL LOW (ref 4.40–5.90)
RDW: 14.8 % — AB (ref 11.5–14.5)
WBC: 11.8 10*3/uL — ABNORMAL HIGH (ref 3.8–10.6)

## 2015-04-06 LAB — BASIC METABOLIC PANEL
ANION GAP: 13 (ref 7–16)
BUN: 6 mg/dL
CALCIUM: 7.8 mg/dL — AB
CHLORIDE: 99 mmol/L — AB
CO2: 21 mmol/L — AB
Creatinine: 0.72 mg/dL
Glucose: 65 mg/dL
Potassium: 3.6 mmol/L
Sodium: 133 mmol/L — ABNORMAL LOW

## 2015-04-06 LAB — MAGNESIUM: Magnesium: 2.1 mg/dL

## 2015-04-06 LAB — PHOSPHORUS: Phosphorus: 3.7 mg/dL

## 2016-01-09 ENCOUNTER — Inpatient Hospital Stay
Admission: EM | Admit: 2016-01-09 | Discharge: 2016-01-10 | DRG: 894 | Discharge status: Against Medical Advice | Payer: Self-pay | Attending: Specialist | Admitting: Specialist

## 2016-01-09 ENCOUNTER — Emergency Department: Payer: Self-pay

## 2016-01-09 ENCOUNTER — Encounter: Payer: Self-pay | Admitting: Emergency Medicine

## 2016-01-09 DIAGNOSIS — R079 Chest pain, unspecified: Secondary | ICD-10-CM

## 2016-01-09 DIAGNOSIS — F10931 Alcohol use, unspecified with withdrawal delirium: Secondary | ICD-10-CM | POA: Diagnosis present

## 2016-01-09 DIAGNOSIS — I10 Essential (primary) hypertension: Secondary | ICD-10-CM | POA: Diagnosis present

## 2016-01-09 DIAGNOSIS — J449 Chronic obstructive pulmonary disease, unspecified: Secondary | ICD-10-CM | POA: Diagnosis present

## 2016-01-09 DIAGNOSIS — F17210 Nicotine dependence, cigarettes, uncomplicated: Secondary | ICD-10-CM | POA: Diagnosis present

## 2016-01-09 DIAGNOSIS — J441 Chronic obstructive pulmonary disease with (acute) exacerbation: Secondary | ICD-10-CM

## 2016-01-09 DIAGNOSIS — F209 Schizophrenia, unspecified: Secondary | ICD-10-CM | POA: Diagnosis present

## 2016-01-09 DIAGNOSIS — F10231 Alcohol dependence with withdrawal delirium: Principal | ICD-10-CM | POA: Diagnosis present

## 2016-01-09 HISTORY — DX: Chronic obstructive pulmonary disease, unspecified: J44.9

## 2016-01-09 HISTORY — DX: Schizophrenia, unspecified: F20.9

## 2016-01-09 HISTORY — DX: Alcohol abuse, uncomplicated: F10.10

## 2016-01-09 HISTORY — DX: Essential (primary) hypertension: I10

## 2016-01-09 LAB — CBC
HCT: 44.5 % (ref 40.0–52.0)
HEMOGLOBIN: 15.3 g/dL (ref 13.0–18.0)
MCH: 33.4 pg (ref 26.0–34.0)
MCHC: 34.3 g/dL (ref 32.0–36.0)
MCV: 97.3 fL (ref 80.0–100.0)
PLATELETS: 266 10*3/uL (ref 150–440)
RBC: 4.58 MIL/uL (ref 4.40–5.90)
RDW: 17.2 % — ABNORMAL HIGH (ref 11.5–14.5)
WBC: 8.6 10*3/uL (ref 3.8–10.6)

## 2016-01-09 LAB — COMPREHENSIVE METABOLIC PANEL
ALK PHOS: 251 U/L — AB (ref 38–126)
ALT: 81 U/L — AB (ref 17–63)
AST: 230 U/L — AB (ref 15–41)
Albumin: 3.9 g/dL (ref 3.5–5.0)
Anion gap: 19 — ABNORMAL HIGH (ref 5–15)
CALCIUM: 9.5 mg/dL (ref 8.9–10.3)
CHLORIDE: 98 mmol/L — AB (ref 101–111)
CO2: 19 mmol/L — AB (ref 22–32)
CREATININE: 0.63 mg/dL (ref 0.61–1.24)
GFR calc non Af Amer: >60 mL/min (ref >60)
GLUCOSE: 91 mg/dL (ref 65–99)
Potassium: 4.2 mmol/L (ref 3.5–5.1)
SODIUM: 136 mmol/L (ref 135–145)
Total Bilirubin: 1.1 mg/dL (ref 0.3–1.2)
Total Protein: 8.1 g/dL (ref 6.5–8.1)

## 2016-01-09 LAB — TROPONIN I: Troponin I: <0.03 ng/mL (ref <0.031)

## 2016-01-09 LAB — LIPASE, BLOOD: Lipase: 38 U/L (ref 11–51)

## 2016-01-09 MED ORDER — LORAZEPAM 2 MG/ML IJ SOLN
INTRAMUSCULAR | Status: AC
  Administered 2016-01-09: 2 mg via INTRAVENOUS
  Filled 2016-01-09: qty 2

## 2016-01-09 MED ORDER — IPRATROPIUM-ALBUTEROL 0.5-2.5 (3) MG/3ML IN SOLN
3.0000 mL | Freq: Once | RESPIRATORY_TRACT | Status: AC
  Administered 2016-01-09: 3 mL via RESPIRATORY_TRACT
  Filled 2016-01-09: qty 3

## 2016-01-09 MED ORDER — LORAZEPAM 2 MG/ML IJ SOLN
1.0000 mg | Freq: Four times a day (QID) | INTRAMUSCULAR | Status: DC | PRN
  Administered 2016-01-10: 1 mg via INTRAVENOUS
  Filled 2016-01-09: qty 1

## 2016-01-09 MED ORDER — THIAMINE HCL 100 MG/ML IJ SOLN: 100.0000 mg | Freq: Every day | INTRAMUSCULAR | Status: DC

## 2016-01-09 MED ORDER — ADULT MULTIVITAMIN W/MINERALS CH
1.0000 | ORAL_TABLET | Freq: Every day | ORAL | Status: DC
  Administered 2016-01-09: 1 via ORAL
  Filled 2016-01-09: qty 1

## 2016-01-09 MED ORDER — LORAZEPAM 2 MG/ML IJ SOLN
2.0000 mg | Freq: Once | INTRAMUSCULAR | Status: AC
  Administered 2016-01-09: 2 mg via INTRAVENOUS

## 2016-01-09 MED ORDER — FOLIC ACID 1 MG PO TABS
1.0000 mg | ORAL_TABLET | Freq: Every day | ORAL | Status: DC
Start: 2016-01-09 — End: 2016-01-10
  Administered 2016-01-09 – 2016-01-10 (×2): 1 mg via ORAL
  Filled 2016-01-09 (×2): qty 1

## 2016-01-09 MED ORDER — METHYLPREDNISOLONE SODIUM SUCC 125 MG IJ SOLR
125.0000 mg | INTRAMUSCULAR | Status: AC
  Administered 2016-01-09: 125 mg via INTRAVENOUS
  Filled 2016-01-09: qty 2

## 2016-01-09 MED ORDER — SODIUM CHLORIDE 0.9 % IV BOLUS (SEPSIS)
1000.0000 mL | Freq: Once | INTRAVENOUS | Status: AC
  Administered 2016-01-09: 1000 mL via INTRAVENOUS

## 2016-01-09 MED ORDER — ASPIRIN 81 MG PO CHEW
324.0000 mg | CHEWABLE_TABLET | Freq: Once | ORAL | Status: AC
  Administered 2016-01-09: 324 mg via ORAL
  Filled 2016-01-09: qty 4

## 2016-01-09 MED ORDER — LORAZEPAM 0.5 MG PO TABS: 1.0000 mg | ORAL_TABLET | Freq: Four times a day (QID) | ORAL | Status: DC | PRN

## 2016-01-09 MED ORDER — LORAZEPAM 2 MG/ML IJ SOLN: 0.0000 mg | Freq: Two times a day (BID) | INTRAMUSCULAR | Status: DC

## 2016-01-09 MED ORDER — THIAMINE HCL 100 MG/ML IJ SOLN
Freq: Once | INTRAVENOUS | Status: AC
  Administered 2016-01-09: 22:00:00 via INTRAVENOUS
  Filled 2016-01-09: qty 1000

## 2016-01-09 MED ORDER — VITAMIN B-1 100 MG PO TABS
100.0000 mg | ORAL_TABLET | Freq: Every day | ORAL | Status: DC
  Administered 2016-01-09: 100 mg via ORAL
  Filled 2016-01-09: qty 1

## 2016-01-09 MED ORDER — LORAZEPAM 2 MG/ML IJ SOLN
2.0000 mg | INTRAMUSCULAR | Status: AC
  Administered 2016-01-09: 2 mg via INTRAVENOUS

## 2016-01-09 MED ORDER — LORAZEPAM 2 MG/ML IJ SOLN: 0.0000 mg | Freq: Four times a day (QID) | INTRAMUSCULAR | Status: DC

## 2016-01-09 NOTE — ED Provider Notes (Signed)
[facility] Emergency Department Provider Note  ____________________________________________  Time seen: Approximately 7:51 PM  I have reviewed the triage vital signs and the nursing notes.   HISTORY  Chief Complaint Chest Pain    HPI Jack Lowe is a 55 y.o. male presents for evaluation of chest pain.  Patient reports been coughing, wheezing and thinks he may have "COPD". He reports that yesterday began feeling a discomfort and pressure over the left side of the chest, is been coughing and with wheezing.  Denies any fever or chills. Reports he is a frequent almost daily cough and he is a "smoker".  Because of the pain and wheezing he has not been able to drink as much and he reports that he almost never drinks water, but almost exclusively drinks alcohol and beer going through 18-24 beers daily for almost 20 years.  Today he's only had 1 or 2 beers and these began experiencing severe "withdrawals". Reports he's been seeing spots in his vision, feels very anxious, shaky, and cannot control his tremors. He reports he has had the same with severe withdrawal in the past but never a seizure.  Reports a moderate pressure-like discomfort in the left upper chest. It does not radiate and is not worsened with exertion. Does not worsen with deep inspiration.  Past Medical History  Diagnosis Date  . COPD (chronic obstructive pulmonary disease) (HCC)   . Hypertension     There are no active problems to display for this patient.   History reviewed. No pertinent past surgical history.  No current outpatient prescriptions on file.  Allergies Toradol  No family history on file.  Social History Social History  Substance Use Topics  . Smoking status: Current Every Day Smoker -- 2.00 packs/day    Types: Cigarettes  . Smokeless tobacco: None  . Alcohol Use: Yes    Review of Systems Constitutional: No fever/chills Eyes: No visual changesExcept states  he might be hallucinating seeing spots at times which she attributes to previous and active which are all. ENT: No sore throat. Cardiovascular: See history of present illness Respiratory: See history of present illness Gastrointestinal: No abdominal pain.  No nausea, no vomiting.  No diarrhea.  No constipation. Genitourinary: Negative for dysuria. Musculoskeletal: Negative for back pain. Skin: Negative for rash. Neurological: Negative for headaches, focal weakness or numbness.  10-point ROS otherwise negative.  ____________________________________________   PHYSICAL EXAM:  VITAL SIGNS: ED Triage Vitals  Enc Vitals Group     BP 01/09/16 1858 114/74 mmHg     Pulse Rate 01/09/16 1858 124     Resp 01/09/16 1858 20     Temp 01/09/16 1858 98.3 F (36.8 C)     Temp Source 01/09/16 1858 Oral     SpO2 01/09/16 1858 95 %     Weight 01/09/16 1858 189 lb (85.73 kg)     Height 01/09/16 1858 5\' 10"  (1.778 m)     Head Cir --      Peak Flow --      Pain Score 01/09/16 1859 7     Pain Loc --      Pain Edu? --      Excl. in GC? --    Constitutional: Alert and oriented. Anxious and tremulous, slightly diaphoretic Eyes: Conjunctivae are normal. PERRL. EOMI. Head: Atraumatic. Nose: No congestion/rhinnorhea. Mouth/Throat: Mucous membranes are dry  Oropharynx non-erythematous. Neck: No stridor.   Cardiovascular: Tachycardic rate, regular rhythm. Grossly normal heart sounds.  Good peripheral circulation. Respiratory:  Mild increased work of breathing, faint end expiratory wheezes heard throughout. Frequent nonproductive cough. Speaks in full and clear sentences. Gastrointestinal: Soft and nontender.  Musculoskeletal: No lower extremity tenderness nor edema.  No joint effusions. Neurologic:  Normal speech and language. No gross focal neurologic deficits are appreciated. Tremulous in all extremities. Mild hyperreflexia. No focal deficits. No clearly altered sensorium Skin:  Skin is warm, dry and  intact. No rash noted. Psychiatric: Mood and affect are slightly anxious. Speech and behavior are normal.  ____________________________________________   LABS (all labs ordered are listed, but only abnormal results are displayed)  Labs Reviewed  CBC - Abnormal; Notable for the following:    RDW 17.2 (*)    All other components within normal limits  COMPREHENSIVE METABOLIC PANEL  LIPASE, BLOOD  TROPONIN I   ____________________________________________  EKG  The baseline on EKG is slightly artifactual Reviewed and interpreted me at 1855 Ventricular rate 124 PR 110 QRS 90 QTc 460 Reviewed and interpreted as sinus tachycardia without obvious ischemic change ____________________________________________  RADIOLOGY     DG Chest Portable 1 View (Final result) Result time: 01/09/16 19:45:50   Final result by Rad Results In Interface (01/09/16 19:45:50)   Narrative:   CLINICAL DATA: Chest pain since yesterday. Cough and wheezing.  EXAM: PORTABLE CHEST 1 VIEW  COMPARISON: 12/06/2014  FINDINGS: Both lungs are clear. Heart and mediastinum are within normal limits. The trachea is midline. Bony thorax appears to be intact.  IMPRESSION: No active disease.   Electronically Signed By: Richarda Overlie M.D. On: 01/09/2016 19:45    ____________________________________________   PROCEDURES  Procedure(s) performed: None  Critical Care performed: Yes, see critical care note(s)  CRITICAL CARE Performed by: Sharyn Creamer   Total critical care time: 35 minutes  Critical care time was exclusive of separately billable procedures and treating other patients.  Critical care was necessary to treat or prevent imminent or life-threatening deterioration.  Critical care was time spent personally by me on the following activities: development of treatment plan with patient and/or surrogate as well as nursing, discussions with consultants, evaluation of patient's response to  treatment, examination of patient, obtaining history from patient or surrogate, ordering and performing treatments and interventions, ordering and review of laboratory studies, ordering and review of radiographic studies, pulse oximetry and re-evaluation of patient's condition.  Patient presents and noted to be in severe alcohol withdrawal with active evidence of hallucinations in the CIWA score of greater than 20. The patient is treated with  ____________________________________________   INITIAL IMPRESSION / ASSESSMENT AND PLAN / ED COURSE  Pertinent labs & imaging results that were available during my care of the patient were reviewed by me and considered in my medical decision making (see chart for details).  The patient presents for evaluation of dyspnea and left-sided chest pain. He is an active smoker but denies history of previous heart disease. His EKG does not demonstrate acute ischemic change, but we will send troponins give aspirin and follow him closely clinically. He is felt to be at moderate risk. The patient in addition clearly demonstrates an expiratory wheezing and has a history of COPD which we will treat here, and I suspect because he is short of breath and not feeling well he has decreased his alcohol use and is now in fairly severe withdrawal with the initial withdrawal score of 29.  ----------------------------------------- 9:10 PM on 01/09/2016 -----------------------------------------  Patient continues to exhibit tachycardia, hypertension. He is fully awake and alert. He appears to have ongoing  significant clinical drawl syndromes including tremulousness. I've written for additional 2 mg of IV Ativan. Patient reports his shortness of breath and wheezing are improving, chest pain has resolved. His lungs are clear and he is in no distress at this time.  Anticipate admission to hospital. Ongoing care including follow-up and metabolic panel, troponin, lipase assigned to Dr.  Scotty Court. The patient will be admitted, but await metabolic panel results. The patient is currently comfortably resting, his heart rates down to 1 teens, no longer tremulous.   ____________________________________________   FINAL CLINICAL IMPRESSION(S) / ED DIAGNOSES  Final diagnoses:  COPD exacerbation (HCC)  Chest pain, moderate coronary artery risk  Alcohol withdrawal delirium, acute, hyperactive (HCC)      Sharyn Creamer, MD 01/09/16 2208

## 2016-01-09 NOTE — ED Notes (Signed)
Chest pain since yesterday, Hx of ETOH. Shaky, appears to be seeing things. Reaching for things.

## 2016-01-10 DIAGNOSIS — J449 Chronic obstructive pulmonary disease, unspecified: Secondary | ICD-10-CM | POA: Diagnosis present

## 2016-01-10 LAB — BASIC METABOLIC PANEL
Anion gap: 15 (ref 5–15)
CO2: 23 mmol/L (ref 22–32)
CREATININE: 0.54 mg/dL — AB (ref 0.61–1.24)
Calcium: 9.2 mg/dL (ref 8.9–10.3)
Chloride: 100 mmol/L — ABNORMAL LOW (ref 101–111)
GFR calc Af Amer: >60 mL/min (ref >60)
GLUCOSE: 164 mg/dL — AB (ref 65–99)
POTASSIUM: 3.6 mmol/L (ref 3.5–5.1)
Sodium: 138 mmol/L (ref 135–145)

## 2016-01-10 LAB — MRSA PCR SCREENING: MRSA by PCR: NEGATIVE

## 2016-01-10 LAB — CBC
HEMATOCRIT: 41.5 % (ref 40.0–52.0)
Hemoglobin: 14.4 g/dL (ref 13.0–18.0)
MCH: 33.5 pg (ref 26.0–34.0)
MCHC: 34.7 g/dL (ref 32.0–36.0)
MCV: 96.6 fL (ref 80.0–100.0)
Platelets: 225 10*3/uL (ref 150–440)
RBC: 4.29 MIL/uL — ABNORMAL LOW (ref 4.40–5.90)
RDW: 16.9 % — AB (ref 11.5–14.5)
WBC: 5.4 10*3/uL (ref 3.8–10.6)

## 2016-01-10 LAB — MAGNESIUM: MAGNESIUM: 1.4 mg/dL — AB (ref 1.7–2.4)

## 2016-01-10 LAB — GLUCOSE, CAPILLARY: Glucose-Capillary: 165 mg/dL — ABNORMAL HIGH (ref 65–99)

## 2016-01-10 MED ORDER — ONDANSETRON HCL 4 MG/2ML IJ SOLN: 4.0000 mg | Freq: Four times a day (QID) | INTRAMUSCULAR | Status: DC | PRN

## 2016-01-10 MED ORDER — LORAZEPAM 2 MG/ML IJ SOLN: 0.0000 mg | Freq: Two times a day (BID) | INTRAMUSCULAR | Status: DC

## 2016-01-10 MED ORDER — LABETALOL HCL 5 MG/ML IV SOLN
10.0000 mg | INTRAVENOUS | Status: DC | PRN
  Administered 2016-01-10 (×5): 10 mg via INTRAVENOUS
  Filled 2016-01-10 (×5): qty 4

## 2016-01-10 MED ORDER — LORAZEPAM 0.5 MG PO TABS: 1.0000 mg | ORAL_TABLET | Freq: Four times a day (QID) | ORAL | Status: DC | PRN

## 2016-01-10 MED ORDER — SODIUM CHLORIDE 0.9% FLUSH
3.0000 mL | Freq: Two times a day (BID) | INTRAVENOUS | Status: DC
  Administered 2016-01-10: 3 mL via INTRAVENOUS

## 2016-01-10 MED ORDER — THIAMINE HCL 100 MG/ML IJ SOLN: 100.0000 mg | Freq: Every day | INTRAMUSCULAR | Status: DC

## 2016-01-10 MED ORDER — FOLIC ACID 1 MG PO TABS: 1.0000 mg | ORAL_TABLET | Freq: Every day | ORAL | Status: DC

## 2016-01-10 MED ORDER — ONDANSETRON HCL 4 MG PO TABS: 4.0000 mg | ORAL_TABLET | Freq: Four times a day (QID) | ORAL | Status: DC | PRN

## 2016-01-10 MED ORDER — ACETAMINOPHEN 325 MG PO TABS: 650.0000 mg | ORAL_TABLET | Freq: Four times a day (QID) | ORAL | Status: DC | PRN

## 2016-01-10 MED ORDER — BUDESONIDE 0.25 MG/2ML IN SUSP
0.2500 mg | Freq: Two times a day (BID) | RESPIRATORY_TRACT | Status: DC
  Administered 2016-01-10: 0.25 mg via RESPIRATORY_TRACT
  Filled 2016-01-10: qty 2

## 2016-01-10 MED ORDER — LORAZEPAM 2 MG/ML IJ SOLN
1.0000 mg | Freq: Four times a day (QID) | INTRAMUSCULAR | Status: DC | PRN
Start: 2016-01-10 — End: 2016-01-10

## 2016-01-10 MED ORDER — NICOTINE 14 MG/24HR TD PT24
14.0000 mg | MEDICATED_PATCH | Freq: Every day | TRANSDERMAL | Status: DC
  Administered 2016-01-10: 14 mg via TRANSDERMAL
  Filled 2016-01-10: qty 2

## 2016-01-10 MED ORDER — ADULT MULTIVITAMIN W/MINERALS CH
1.0000 | ORAL_TABLET | Freq: Every day | ORAL | Status: DC
  Administered 2016-01-10: 1 via ORAL
  Filled 2016-01-10: qty 1

## 2016-01-10 MED ORDER — LORAZEPAM 2 MG/ML IJ SOLN
INTRAMUSCULAR | Status: AC
  Administered 2016-01-10: 1 mg via INTRAVENOUS
  Filled 2016-01-10: qty 1

## 2016-01-10 MED ORDER — BECLOMETHASONE DIPROPIONATE 40 MCG/ACT IN AERS
1.0000 | INHALATION_SPRAY | Freq: Two times a day (BID) | RESPIRATORY_TRACT | Status: DC
  Filled 2016-01-10: qty 8.7

## 2016-01-10 MED ORDER — SODIUM CHLORIDE 0.9 % IV SOLN
INTRAVENOUS | Status: AC
  Administered 2016-01-10: 02:00:00 via INTRAVENOUS

## 2016-01-10 MED ORDER — ENOXAPARIN SODIUM 40 MG/0.4ML ~~LOC~~ SOLN
40.0000 mg | Freq: Every day | SUBCUTANEOUS | Status: DC
  Administered 2016-01-10: 40 mg via SUBCUTANEOUS
  Filled 2016-01-10: qty 0.4

## 2016-01-10 MED ORDER — LORAZEPAM 2 MG/ML IJ SOLN
0.0000 mg | Freq: Four times a day (QID) | INTRAMUSCULAR | Status: DC
  Administered 2016-01-10 (×3): 2 mg via INTRAVENOUS
  Filled 2016-01-10 (×3): qty 1

## 2016-01-10 MED ORDER — METOPROLOL SUCCINATE ER 25 MG PO TB24
25.0000 mg | ORAL_TABLET | Freq: Every day | ORAL | Status: DC
  Administered 2016-01-10: 25 mg via ORAL
  Filled 2016-01-10: qty 1

## 2016-01-10 MED ORDER — TIOTROPIUM BROMIDE MONOHYDRATE 18 MCG IN CAPS
18.0000 ug | ORAL_CAPSULE | Freq: Every day | RESPIRATORY_TRACT | Status: DC
  Administered 2016-01-10: 18 ug via RESPIRATORY_TRACT
  Filled 2016-01-10: qty 5

## 2016-01-10 MED ORDER — ACETAMINOPHEN 650 MG RE SUPP: 650.0000 mg | Freq: Four times a day (QID) | RECTAL | Status: DC | PRN

## 2016-01-10 MED ORDER — LORAZEPAM 2 MG/ML IJ SOLN
1.0000 mg | INTRAMUSCULAR | Status: DC | PRN
  Administered 2016-01-10 (×2): 1 mg via INTRAVENOUS
  Filled 2016-01-10: qty 1

## 2016-01-10 MED ORDER — LORAZEPAM 2 MG/ML IJ SOLN
1.0000 mg | INTRAMUSCULAR | Status: DC | PRN
  Administered 2016-01-10: 1 mg via INTRAVENOUS
  Filled 2016-01-10: qty 1

## 2016-01-10 MED ORDER — VITAMIN B-1 100 MG PO TABS
100.0000 mg | ORAL_TABLET | Freq: Every day | ORAL | Status: DC
  Administered 2016-01-10: 100 mg via ORAL
  Filled 2016-01-10: qty 1

## 2016-01-10 MED ORDER — MAGNESIUM SULFATE 4 GM/100ML IV SOLN
4.0000 g | Freq: Once | INTRAVENOUS | Status: AC
  Administered 2016-01-10: 4 g via INTRAVENOUS
  Filled 2016-01-10: qty 100

## 2016-01-10 NOTE — Progress Notes (Signed)
MEDICATION RELATED CONSULT NOTE   Pharmacy Consult for electrolyte management   Allergies  Allergen Reactions  . Toradol [Ketorolac Tromethamine] Swelling    Patient Measurements: Height: 5\' 9"  (175.3 cm) Weight: 192 lb 14.4 oz (87.5 kg) IBW/kg (Calculated) : 70.7   Vital Signs: Temp: 98.6 F (37 C) (05/09 0700) Temp Source: Oral (05/09 0700) BP: 163/95 mmHg (05/09 1100) Pulse Rate: 94 (05/09 1100) Intake/Output from previous day: 05/08 0701 - 05/09 0700 In: 341.3 [I.V.:341.3] Out: 450 [Urine:450] Intake/Output from this shift:    Labs:  Recent Labs  01/09/16 1914 01/10/16 0334  WBC 8.6 5.4  HGB 15.3 14.4  HCT 44.5 41.5  PLT 266 225  CREATININE 0.63 0.54*  MG  --  1.4*  ALBUMIN 3.9  --   PROT 8.1  --   AST 230*  --   ALT 81*  --   ALKPHOS 251*  --   BILITOT 1.1  --    Estimated Creatinine Clearance: 118.3 mL/min (by C-G formula based on Cr of 0.54).  Assessment: Pharmacy consulted for electrolyte replacement for 55 yo male admitted with COPD exacerbation and EtOH withdrawal.   Plan:  Will order magensium 4g IV x 1. Goal magnesium > 2.   Will order follow up electrolytes with am labs.   Pharmacy will continue to monitor and adjust per consult.   Favian Kittleson L 01/10/2016,11:37 AM

## 2016-01-10 NOTE — Progress Notes (Signed)
Pt c/o continued tremors and anxiety. Pt has Ativan 1mg  ordered PRN for withdrawal symptoms. Dr. Cherlynn KaiserSainani notified acknowledged and new orders received. Jarmarcus Wambold E 9:07 AM 01/10/2016

## 2016-01-10 NOTE — Progress Notes (Signed)
Notified by nurse that pt wanted to smoke and therefore left AGAINST MEDICAL ADVICE.

## 2016-01-10 NOTE — ED Notes (Signed)
Dr. Truitt MerleNotified that pt needs more frequent CIWA assessment and meds than q6hrs. States will consider it.

## 2016-01-10 NOTE — Progress Notes (Signed)
Patient left hospital AMA, MD aware. Skin warm and dry, in NAD. Risks of leaving AMA including death discussed with patient and family, papers signed and in chart, pt and family verbalized understanding of risks. IV removed.

## 2016-01-10 NOTE — Progress Notes (Signed)
[facility] - [place] at [facility]   PATIENT NAME: Jack Lowe    MR#:  [id number]  DATE OF BIRTH:  01-01-1961  SUBJECTIVE:   Pt. Here due to ETOH withdrawal.  Still having some tremors, tachycardia.  No hallucinations presently.    REVIEW OF SYSTEMS:    Review of Systems  Constitutional: Negative for fever and chills.  HENT: Negative for congestion and tinnitus.   Eyes: Negative for blurred vision and double vision.  Respiratory: Negative for cough, shortness of breath and wheezing.   Cardiovascular: Negative for chest pain, orthopnea and PND.  Gastrointestinal: Negative for nausea, vomiting, abdominal pain and diarrhea.  Genitourinary: Negative for dysuria and hematuria.  Neurological: Positive for tremors. Negative for dizziness, sensory change and focal weakness.  Psychiatric/Behavioral: Positive for substance abuse.  All other systems reviewed and are negative.   Nutrition: Regular diet Tolerating Diet: Yes Tolerating PT: Await Eval.    DRUG ALLERGIES:   Allergies  Allergen Reactions  . Toradol [Ketorolac Tromethamine] Swelling    VITALS:  Blood pressure 128/54, pulse 93, temperature 98.5 F (36.9 C), temperature source Oral, resp. rate 21, height 5\' 9"  (1.753 m), weight 87.5 kg (192 lb 14.4 oz), SpO2 92 %.  PHYSICAL EXAMINATION:   Physical Exam  GENERAL:  55 y.o.-year-old patient lying in the bed tremulous but in NAD.   EYES: Pupils equal, round, reactive to light and accommodation. No scleral icterus. Extraocular muscles intact.  HEENT: Head atraumatic, normocephalic. Oropharynx and nasopharynx clear. Dry Oral Mucosa.  NECK:  Supple, no jugular venous distention. No thyroid enlargement, no tenderness.  LUNGS: Normal breath sounds bilaterally, no wheezing, rales, rhonchi. No use of accessory muscles of respiration.  CARDIOVASCULAR: S1, S2 normal. No murmurs, rubs, or gallops.  ABDOMEN: Soft, nontender, nondistended. Bowel sounds present.  No organomegaly or mass.  EXTREMITIES: No cyanosis, clubbing or edema b/l.    NEUROLOGIC: Cranial nerves II through XII are intact. No focal Motor or sensory deficits b/l.   PSYCHIATRIC: The patient is alert and oriented x 3.  SKIN: No obvious rash, lesion, or ulcer.    LABORATORY PANEL:   CBC  Recent Labs Lab 01/10/16 0334  WBC 5.4  HGB 14.4  HCT 41.5  PLT 225   ------------------------------------------------------------------------------------------------------------------  Chemistries   Recent Labs Lab 01/09/16 1914 01/10/16 0334  NA 136 138  K 4.2 3.6  CL 98* 100*  CO2 19* 23  GLUCOSE 91 164*  BUN <5* <5*  CREATININE 0.63 0.54*  CALCIUM 9.5 9.2  MG  --  1.4*  AST 230*  --   ALT 81*  --   ALKPHOS 251*  --   BILITOT 1.1  --    ------------------------------------------------------------------------------------------------------------------  Cardiac Enzymes  Recent Labs Lab 01/09/16 1914  TROPONINI <0.03   ------------------------------------------------------------------------------------------------------------------  RADIOLOGY:  Dg Chest Portable 1 View  01/09/2016  CLINICAL DATA:  Chest pain since yesterday.  Cough and wheezing. EXAM: PORTABLE CHEST 1 VIEW COMPARISON:  12/06/2014 FINDINGS: Both lungs are clear. Heart and mediastinum are within normal limits. The trachea is midline. Bony thorax appears to be intact. IMPRESSION: No active disease. Electronically Signed   By: Richarda OverlieAdam  Henn M.D.   On: 01/09/2016 19:45     ASSESSMENT AND PLAN:   55 yo male w/ hx of COPD, HTN, ETOH abuse, Schizophrenia came into hospital due to ETOH withdrawal.   1. ETOH withdrawal - pt. Drinks about 12-16 beers daily.  Last drink was 1-1.5 days ago.  - cont. CIWA protocol.  Cont. Ativan PRN.  - if needed may consider Precedex gtt.  - cont. Thiamine, Folate.   2. COPD - no acute exacerbation.  - cont. duonebs PRN.  Cont. Pulmicort nebs.  - cont. Spiriva  3. HTN -  cont. Metoprolol.  - bp a bit accelerated due to ETOH withdrawal.  Cont. PRN Labetalol.   4. Hypomagnesemia - will replace and repeat in a.m.   5. Tobacco abuse - cont. Nicotine patch.   All the records are reviewed and case discussed with Care Management/Social Workerr. Management plans discussed with the patient, family and they are in agreement.  CODE STATUS: Full code  DVT Prophylaxis: Lovenox  TOTAL TIME TAKING CARE OF THIS PATIENT: 30 minutes.   POSSIBLE D/C IN 1-2 DAYS, DEPENDING ON CLINICAL CONDITION.   Houston Siren M.D on 01/10/2016 at 1:24 PM  Between 7am to 6pm - Pager - [phone]  After 6pm go to [email or web address] - password [email or web address] [facility]  [facility]  Office  [phone]  CC: Primary care physician; No primary care provider on file.

## 2016-01-10 NOTE — H&P (Signed)
[facility] [place] at [facility]   PATIENT NAME: Jack MiuFreddie Lowe    MR#:  [id number]  DATE OF BIRTH:  [birth year]  DATE OF ADMISSION:  01/09/2016  PRIMARY CARE PHYSICIAN: No primary care provider on file.   REQUESTING/REFERRING PHYSICIAN: Scotty CourtStafford, MD  CHIEF COMPLAINT:   Chief Complaint  Patient presents with  . Chest Pain    HISTORY OF PRESENT ILLNESS:  Jack Lowe  is a 55 y.o. male who presents with a complaint of chest pain. Patient states that he has COPD and feels like that might be the cause of this chest pain. However, on arrival here he was noted to be tachycardic and hypertensive, and fairly anxious. On further interview he stated that he is typically heavy drinker but has not had any alcohol for a number of days. He has been treated in the hospital for withdrawal before, but it was during an admission for pneumonia. He denies any prior withdrawal seizures. He was given some treatment initially in the ED for possible COPD exacerbation, though it became apparent that this was more likely withdrawal and delirium tremens hospitalists were called for admission.  PAST MEDICAL HISTORY:   Past Medical History  Diagnosis Date  . COPD (chronic obstructive pulmonary disease) (HCC)   . Hypertension   . Alcohol abuse   . Schizophrenia (HCC)     PAST SURGICAL HISTORY:   Past Surgical History  Procedure Laterality Date  . Colonoscopy      SOCIAL HISTORY:   Social History  Substance Use Topics  . Smoking status: Current Every Day Smoker -- 2.00 packs/day    Types: Cigarettes  . Smokeless tobacco: Not on file  . Alcohol Use: 0.0 oz/week    0 Standard drinks or equivalent per week    FAMILY HISTORY:   Family History  Problem Relation Age of Onset  . Hyperlipidemia    . Lung cancer      DRUG ALLERGIES:   Allergies  Allergen Reactions  . Toradol [Ketorolac Tromethamine] Swelling    MEDICATIONS AT HOME:   Prior to Admission  medications   Not on File    REVIEW OF SYSTEMS:  Review of Systems  Constitutional: Positive for malaise/fatigue. Negative for fever, chills and weight loss.  HENT: Negative for ear pain, hearing loss and tinnitus.   Eyes: Negative for blurred vision, double vision, pain and redness.  Respiratory: Negative for cough, hemoptysis and shortness of breath.   Cardiovascular: Positive for chest pain. Negative for palpitations, orthopnea and leg swelling.  Gastrointestinal: Negative for nausea, vomiting, abdominal pain, diarrhea and constipation.  Genitourinary: Negative for dysuria, frequency and hematuria.  Musculoskeletal: Negative for back pain, joint pain and neck pain.  Skin:       No acne, rash, or lesions  Neurological: Negative for dizziness, tremors, focal weakness and weakness.  Endo/Heme/Allergies: Negative for polydipsia. Does not bruise/bleed easily.  Psychiatric/Behavioral: Negative for depression. The patient is not nervous/anxious and does not have insomnia.      VITAL SIGNS:   Filed Vitals:   01/09/16 2200 01/09/16 2230 01/09/16 2300 01/09/16 2330  BP: 169/97 187/95 156/85 176/94  Pulse: 127 122 116 132  Temp:      TempSrc:      Resp: 21 20 21 17   Height:      Weight:      SpO2: 91% 94% 92% 96%   Wt Readings from Last 3 Encounters:  01/09/16 85.73 kg (189 lb)    PHYSICAL EXAMINATION:  Physical Exam  Vitals reviewed. Constitutional: He is oriented to person, place, and time. He appears well-developed and well-nourished. He appears distressed (generally uncomfortable with notable tremor).  HENT:  Head: Normocephalic and atraumatic.  Mouth/Throat: Oropharynx is clear and moist.  Eyes: Conjunctivae and EOM are normal. Pupils are equal, round, and reactive to light. No scleral icterus.  Neck: Normal range of motion. Neck supple. No JVD present. No thyromegaly present.  Cardiovascular: Regular rhythm and intact distal pulses.  Exam reveals no gallop and no friction  rub.   No murmur heard. Tachycardic  Respiratory: Effort normal and breath sounds normal. No respiratory distress. He has no wheezes. He has no rales.  GI: Soft. Bowel sounds are normal. He exhibits no distension. There is no tenderness.  Musculoskeletal: Normal range of motion. He exhibits no edema.  No arthritis, no gout  Lymphadenopathy:    He has no cervical adenopathy.  Neurological: He is alert and oriented to person, place, and time. No cranial nerve deficit.  No dysarthria, no aphasia  Skin: Skin is warm and dry. No rash noted. No erythema.  Psychiatric: He has a normal mood and affect. His behavior is normal. Judgment and thought content normal.    LABORATORY PANEL:   CBC  Recent Labs Lab 01/09/16 1914  WBC 8.6  HGB 15.3  HCT 44.5  PLT 266   ------------------------------------------------------------------------------------------------------------------  Chemistries   Recent Labs Lab 01/09/16 1914  NA 136  K 4.2  CL 98*  CO2 19*  GLUCOSE 91  BUN <5*  CREATININE 0.63  CALCIUM 9.5  AST 230*  ALT 81*  ALKPHOS 251*  BILITOT 1.1   ------------------------------------------------------------------------------------------------------------------  Cardiac Enzymes  Recent Labs Lab 01/09/16 1914  TROPONINI <0.03   ------------------------------------------------------------------------------------------------------------------  RADIOLOGY:  Dg Chest Portable 1 View  01/09/2016  CLINICAL DATA:  Chest pain since yesterday.  Cough and wheezing. EXAM: PORTABLE CHEST 1 VIEW COMPARISON:  12/06/2014 FINDINGS: Both lungs are clear. Heart and mediastinum are within normal limits. The trachea is midline. Bony thorax appears to be intact. IMPRESSION: No active disease. Electronically Signed   By: Richarda Overlie M.D.   On: 01/09/2016 19:45    EKG:   Orders placed or performed during the hospital encounter of 01/09/16  . EKG 12-Lead  . EKG 12-Lead    IMPRESSION AND  PLAN:  Principal Problem:   Delirium tremens (HCC) - patient received multiple rounds of Ativan in the ED, after which his confusion improved some. He is still persistently tachycardic and hypertensive. We will admit him to stepdown unit with CIWA protocol. If his symptoms persist despite treatment with Ativan, we may need to switch to Precedex. Active Problems:   COPD (chronic obstructive pulmonary disease) (HCC) - continue home inhalers  All the records are reviewed and case discussed with ED provider. Management plans discussed with the patient and/or family.  DVT PROPHYLAXIS: SubQ lovenox  GI PROPHYLAXIS: None  ADMISSION STATUS: Inpatient  CODE STATUS: Full Code Status History    This patient does not have a recorded code status. Please follow your organizational policy for patients in this situation.      TOTAL TIME TAKING CARE OF THIS PATIENT: 45 minutes.    Orchid Glassberg FIELDING 01/10/2016, 12:03 AM  [facility]  Office  [phone]  CC: Primary care physician; No primary care provider on file.

## 2016-01-10 NOTE — ED Provider Notes (Signed)
Assumed care of the patient at 11 PM from Dr. Fanny BienQuale pending labs. Lab results are essentially unremarkable other than evidence of hepatitis which is likely due to the severe alcohol intake. the patient does continue to have severe withdrawal. Case discussed with hospitalist for admission.  Sharman CheekPhillip Geraline Halberstadt, MD 01/10/16 [phone]

## 2016-01-11 NOTE — Discharge Summary (Signed)
01/09/2016  7:01 PM  Discharge date - 01/10/16 AMA  Patient Active Problem List   Diagnosis Date Noted  . COPD (chronic obstructive pulmonary disease) (HCC) 01/10/2016  . Delirium tremens (HCC) 01/09/2016     Pt. Left AGAINST MEDICAL ADVICE.   Hospital Course:  55 yo male w/ hx of COPD, HTN, ETOH abuse, Schizophrenia came into hospital due to ETOH withdrawal.   1. ETOH withdrawal - pt. Drinks about 12-16 beers daily. Last drink was 1-1.5 days ago.  - cont. CIWA protocol. Cont. Ativan PRN.  - if needed may consider Precedex gtt.  - cont. Thiamine, Folate.   2. COPD - no acute exacerbation.  - cont. duonebs PRN. Cont. Pulmicort nebs.  - cont. Spiriva  3. HTN - cont. Metoprolol.  - bp a bit accelerated due to ETOH withdrawal. Cont. PRN Labetalol.   4. Hypomagnesemia - will replace and repeat in a.m.   5. Tobacco abuse - cont. Nicotine patch.   Pt. Was being managed as above for this ETOH withdrawal and wanted to go out to smoke and left AMA on the evening of 01/10/16.

## 2016-04-27 ENCOUNTER — Emergency Department
Admission: EM | Admit: 2016-04-27 | Discharge: 2016-04-28 | Disposition: A | Discharge status: Home / Self Care | Payer: Medicaid - Out of State | Attending: Emergency Medicine | Admitting: Emergency Medicine

## 2016-04-27 ENCOUNTER — Emergency Department: Payer: Medicaid - Out of State

## 2016-04-27 DIAGNOSIS — J441 Chronic obstructive pulmonary disease with (acute) exacerbation: Secondary | ICD-10-CM | POA: Insufficient documentation

## 2016-04-27 DIAGNOSIS — I10 Essential (primary) hypertension: Secondary | ICD-10-CM | POA: Insufficient documentation

## 2016-04-27 DIAGNOSIS — F17210 Nicotine dependence, cigarettes, uncomplicated: Secondary | ICD-10-CM | POA: Insufficient documentation

## 2016-04-27 DIAGNOSIS — R079 Chest pain, unspecified: Secondary | ICD-10-CM

## 2016-04-27 DIAGNOSIS — Z79899 Other long term (current) drug therapy: Secondary | ICD-10-CM | POA: Insufficient documentation

## 2016-04-27 LAB — CBC
HEMATOCRIT: 41.1 % (ref 40.0–52.0)
Hemoglobin: 14.2 g/dL (ref 13.0–18.0)
MCH: 32.1 pg (ref 26.0–34.0)
MCHC: 34.5 g/dL (ref 32.0–36.0)
MCV: 92.8 fL (ref 80.0–100.0)
Platelets: 285 10*3/uL (ref 150–440)
RBC: 4.43 MIL/uL (ref 4.40–5.90)
RDW: 15.8 % — AB (ref 11.5–14.5)
WBC: 10.5 10*3/uL (ref 3.8–10.6)

## 2016-04-27 LAB — BASIC METABOLIC PANEL
Anion gap: 13 (ref 5–15)
BUN: 11 mg/dL (ref 6–20)
CHLORIDE: 96 mmol/L — AB (ref 101–111)
CO2: 22 mmol/L (ref 22–32)
Calcium: 9 mg/dL (ref 8.9–10.3)
Creatinine, Ser: 0.6 mg/dL — ABNORMAL LOW (ref 0.61–1.24)
GFR calc Af Amer: >60 mL/min (ref >60)
GFR calc non Af Amer: >60 mL/min (ref >60)
GLUCOSE: 99 mg/dL (ref 65–99)
POTASSIUM: 4.8 mmol/L (ref 3.5–5.1)
Sodium: 131 mmol/L — ABNORMAL LOW (ref 135–145)

## 2016-04-27 LAB — TROPONIN I: Troponin I: <0.03 ng/mL (ref <0.03)

## 2016-04-27 NOTE — ED Triage Notes (Signed)
Pt to triage via wheelchair. Pt reports shortness of breath for "sometime" since being dx'd with pneumonia last year. Pt reports developed chest pain about 2 hours ago. Pain is to his left chest. Pt also reports pain to his right flank that radiates around his right upper abd and into his left chest.  Pt reports hx of COPD and alcoholism. States he smokes about a pack a day and has drank 10 beers today.

## 2016-04-28 LAB — TROPONIN I

## 2016-04-28 MED ORDER — IPRATROPIUM-ALBUTEROL 0.5-2.5 (3) MG/3ML IN SOLN
3.0000 mL | Freq: Once | RESPIRATORY_TRACT | Status: AC
  Administered 2016-04-28: 3 mL via RESPIRATORY_TRACT
  Filled 2016-04-28: qty 3

## 2016-04-28 MED ORDER — PREDNISONE 20 MG PO TABS
60.0000 mg | ORAL_TABLET | ORAL | Status: AC
  Administered 2016-04-28: 60 mg via ORAL
  Filled 2016-04-28: qty 3

## 2016-04-28 MED ORDER — PREDNISONE 20 MG PO TABS: 60.0000 mg | ORAL_TABLET | Freq: Every day | ORAL | 0 refills | Status: AC

## 2016-04-28 NOTE — ED Notes (Signed)
Pt. States left sided chest pain that radiates to lt. Shoulder and back that started about 2 hours ago.  Pt. Denies cardiac hx.  Pt. States hx of COPD.  Pt. States he had pneumonia last year.  Pt. States increase of 50 lbs in the last 8 months.

## 2016-04-28 NOTE — Discharge Instructions (Signed)
You have been seen in the Emergency Department (ED) today for chest pain and shortness of breath.  As we have discussed today?s test results are normal, but you may require further testing, though we believe your symptoms are most likely related to a mild COPD exacerbation.  Please follow up with the recommended doctor as instructed above in these documents regarding today?s emergent visit and your recent symptoms to discuss further management.  Continue to take your regular medications. If you are not doing so already, please also take a daily baby aspirin (81 mg), at least until you follow up with your doctor.  Return to the Emergency Department (ED) if you experience any further chest pain/pressure/tightness, difficulty breathing, or sudden sweating, or other symptoms that concern you.

## 2016-04-28 NOTE — ED Notes (Signed)
Pt. States he has had 10 beers today.

## 2016-04-28 NOTE — ED Provider Notes (Signed)
[facility] Emergency Department Provider Note  ____________________________________________   First MD Initiated Contact with Patient 04/28/16 0004     (approximate)  I have reviewed the triage vital signs and the nursing notes.   HISTORY  Chief Complaint Shortness of Breath and Chest Pain    HPI Jack Lowe is a 55 y.o. male with a history of COPD and heavy alcohol abuse as well as schizophrenia and ongoing tobacco abuse who presents for evaluation of gradual onset shortness of breath over the last few days made worse with exertion.  He states it feels similar to his usual COPD.  He reports taking all of his regular medications.  He also had onset several hours ago of vague and diffuse sharp chest pain.He has had similar pains in the prior and it is since resolved.  He denies fever/chills, nausea/vomiting, abdominal pain, dysuria.  He has a regular doctor that follows him for his COPD.  He does admit to having 10-12 beers earlier tonight which is normal for him.   Past Medical History:  Diagnosis Date  . Alcohol abuse   . COPD (chronic obstructive pulmonary disease) (HCC)   . Hypertension   . Schizophrenia [facility](HCC)     Patient Active Problem List   Diagnosis Date Noted  . COPD (chronic obstructive pulmonary disease) (HCC) 01/10/2016  . Delirium tremens (HCC) 01/09/2016    Past Surgical History:  Procedure Laterality Date  . COLONOSCOPY      Prior to Admission medications   Medication Sig Start Date End Date Taking? Authorizing Provider  beclomethasone (QVAR) 40 MCG/ACT inhaler Inhale 1 puff into the lungs 2 (two) times daily.    Historical Provider, MD  metoprolol succinate (TOPROL-XL) 25 MG 24 hr tablet Take 25 mg by mouth daily.    Historical Provider, MD  predniSONE (DELTASONE) 20 MG tablet Take 3 tablets (60 mg total) by mouth daily. 04/28/16   Loleta Roseory Kely Dohn, MD  tiotropium (SPIRIVA) 18 MCG inhalation capsule Place 18 mcg into inhaler and  inhale daily.    Historical Provider, MD    Allergies Toradol [ketorolac tromethamine]  Family History  Problem Relation Age of Onset  . Hyperlipidemia    . Lung cancer      Social History Social History  Substance Use Topics  . Smoking status: Current Every Day Smoker    Packs/day: 2.00    Types: Cigarettes  . Smokeless tobacco: Not on file  . Alcohol use 0.0 oz/week    Review of Systems Constitutional: No fever/chills Eyes: No visual changes. ENT: No sore throat. Cardiovascular: +chest pain. Respiratory: +shortness of breath. Gastrointestinal: No abdominal pain.  No nausea, no vomiting.  No diarrhea.  No constipation. Genitourinary: Negative for dysuria. Musculoskeletal: Negative for back pain. Skin: Negative for rash. Neurological: Negative for headaches, focal weakness or numbness.  10-point ROS otherwise negative.  ____________________________________________   PHYSICAL EXAM:  VITAL SIGNS: ED Triage Vitals  Enc Vitals Group     BP 04/27/16 2152 138/89     Pulse Rate 04/27/16 2152 82     Resp 04/27/16 2152 (!) 22     Temp 04/27/16 2152 97.7 F (36.5 C)     Temp Source 04/27/16 2152 Oral     SpO2 04/27/16 2152 93 %     Weight 04/27/16 2153 210 lb (95.3 kg)     Height 04/27/16 2153 5\' 8"  (1.727 m)     Head Circumference --      Peak Flow --  Pain Score 04/27/16 2153 6     Pain Loc --      Pain Edu? --      Excl. in GC? --     Constitutional: Alert and oriented. Well appearing and in no acute distress. Eyes: Conjunctivae are normal. PERRL. EOMI. Head: Atraumatic. Nose: No congestion/rhinnorhea. Mouth/Throat: Mucous membranes are moist.  Oropharynx non-erythematous. Neck: No stridor.  No meningeal signs.   Cardiovascular: Normal rate, regular rhythm. Good peripheral circulation. Grossly normal heart sounds. Respiratory: Normal respiratory effort.  No retractions.  Mild expiratory wheezes bilaterally Gastrointestinal: Soft and nontender. No  distention.  Musculoskeletal: No lower extremity tenderness nor edema. No gross deformities of extremities. Neurologic:  Normal speech and language. No gross focal neurologic deficits are appreciated.  Skin:  Skin is warm, dry and intact. No rash noted. Psychiatric: Mood and affect are normal. Speech and behavior are normal.  ____________________________________________   LABS (all labs ordered are listed, but only abnormal results are displayed)  Labs Reviewed  BASIC METABOLIC PANEL - Abnormal; Notable for the following:       Result Value   Sodium 131 (*)    Chloride 96 (*)    Creatinine, Ser 0.60 (*)    All other components within normal limits  CBC - Abnormal; Notable for the following:    RDW 15.8 (*)    All other components within normal limits  TROPONIN I  TROPONIN I   ____________________________________________  EKG  ED ECG REPORT I, Shaylene Paganelli, the attending physician, personally viewed and interpreted this ECG.  Date: 04/27/2016 EKG Time: 21:55 Rate: 84 Rhythm: normal sinus rhythm QRS Axis: normal Intervals: normal ST/T Wave abnormalities: normal Conduction Disturbances: none Narrative Interpretation: unremarkable  ____________________________________________  RADIOLOGY   Dg Chest 2 View  Result Date: 04/27/2016 CLINICAL DATA:  Shortness of breath since diagnosis with pneumonia last year. Chest pain for 2 hours. History of COPD on alcoholism. EXAM: CHEST  2 VIEW COMPARISON:  Chest radiograph Jan 09, 2016 FINDINGS: Cardiac silhouette is mildly enlarged, mediastinal silhouette is nonsuspicious. Moderate bronchitic changes without pleural effusion or focal consolidation. No pneumothorax. Soft tissue planes and included osseous structures are nonsuspicious. IMPRESSION: Mild cardiomegaly.  Bronchitic changes without focal consolidation. Electronically Signed   By: Awilda Metro M.D.   On: 04/27/2016 23:07     ____________________________________________   PROCEDURES  Procedure(s) performed:   Procedures   Critical Care performed: No ____________________________________________   INITIAL IMPRESSION / ASSESSMENT AND PLAN / ED COURSE  Pertinent labs & imaging results that were available during my care of the patient were reviewed by me and considered in my medical decision making (see chart for details).  The patient appears to be having a mild COPD exacerbation.  I will treat him with DuoNeb nebs 3 and a short course of prednisone.  I will check a second troponin but his initial troponin was negative.  His EKG is reassuring.  I discussed this with the patient and his family and they are all in agreement with plan.   Clinical Course  Comment By Time  Breathing better, CP resolved.  Second troponin negative.  D/C'ing for outpatient follow up.  I gave my usual and customary return precautions. Loleta Rose, MD 08/26 0151  Did not give ASA given reported hx of swelling with NSAIDs. Loleta Rose, MD 08/26 0153    ____________________________________________  FINAL CLINICAL IMPRESSION(S) / ED DIAGNOSES  Final diagnoses:  COPD exacerbation (HCC)  Chest pain, unspecified chest pain type  MEDICATIONS GIVEN DURING THIS VISIT:  Medications  predniSONE (DELTASONE) tablet 60 mg (60 mg Oral Given 04/28/16 0041)  ipratropium-albuterol (DUONEB) 0.5-2.5 (3) MG/3ML nebulizer solution 3 mL (3 mLs Nebulization Given 04/28/16 0045)  ipratropium-albuterol (DUONEB) 0.5-2.5 (3) MG/3ML nebulizer solution 3 mL (3 mLs Nebulization Given 04/28/16 0044)  ipratropium-albuterol (DUONEB) 0.5-2.5 (3) MG/3ML nebulizer solution 3 mL (3 mLs Nebulization Given 04/28/16 0040)     NEW OUTPATIENT MEDICATIONS STARTED DURING THIS VISIT:  New Prescriptions   PREDNISONE (DELTASONE) 20 MG TABLET    Take 3 tablets (60 mg total) by mouth daily.      Note:  This document was prepared using Dragon voice  recognition software and may include unintentional dictation errors.    Loleta Rose, MD 04/28/16 [phone]

## 2016-09-09 IMAGING — CR DG HUMERUS 2V *L*
1 series · 2 of 2 positions shown · non-contrast
Comparison: None.

CLINICAL DATA: Fall yesterday.  Initial encounter.

EXAM:
LEFT HUMERUS - 2+ VIEW

[Series 1: w humerus ap left · 0.14mm/px · 2 of 2 slices shown]
[im 1/2]
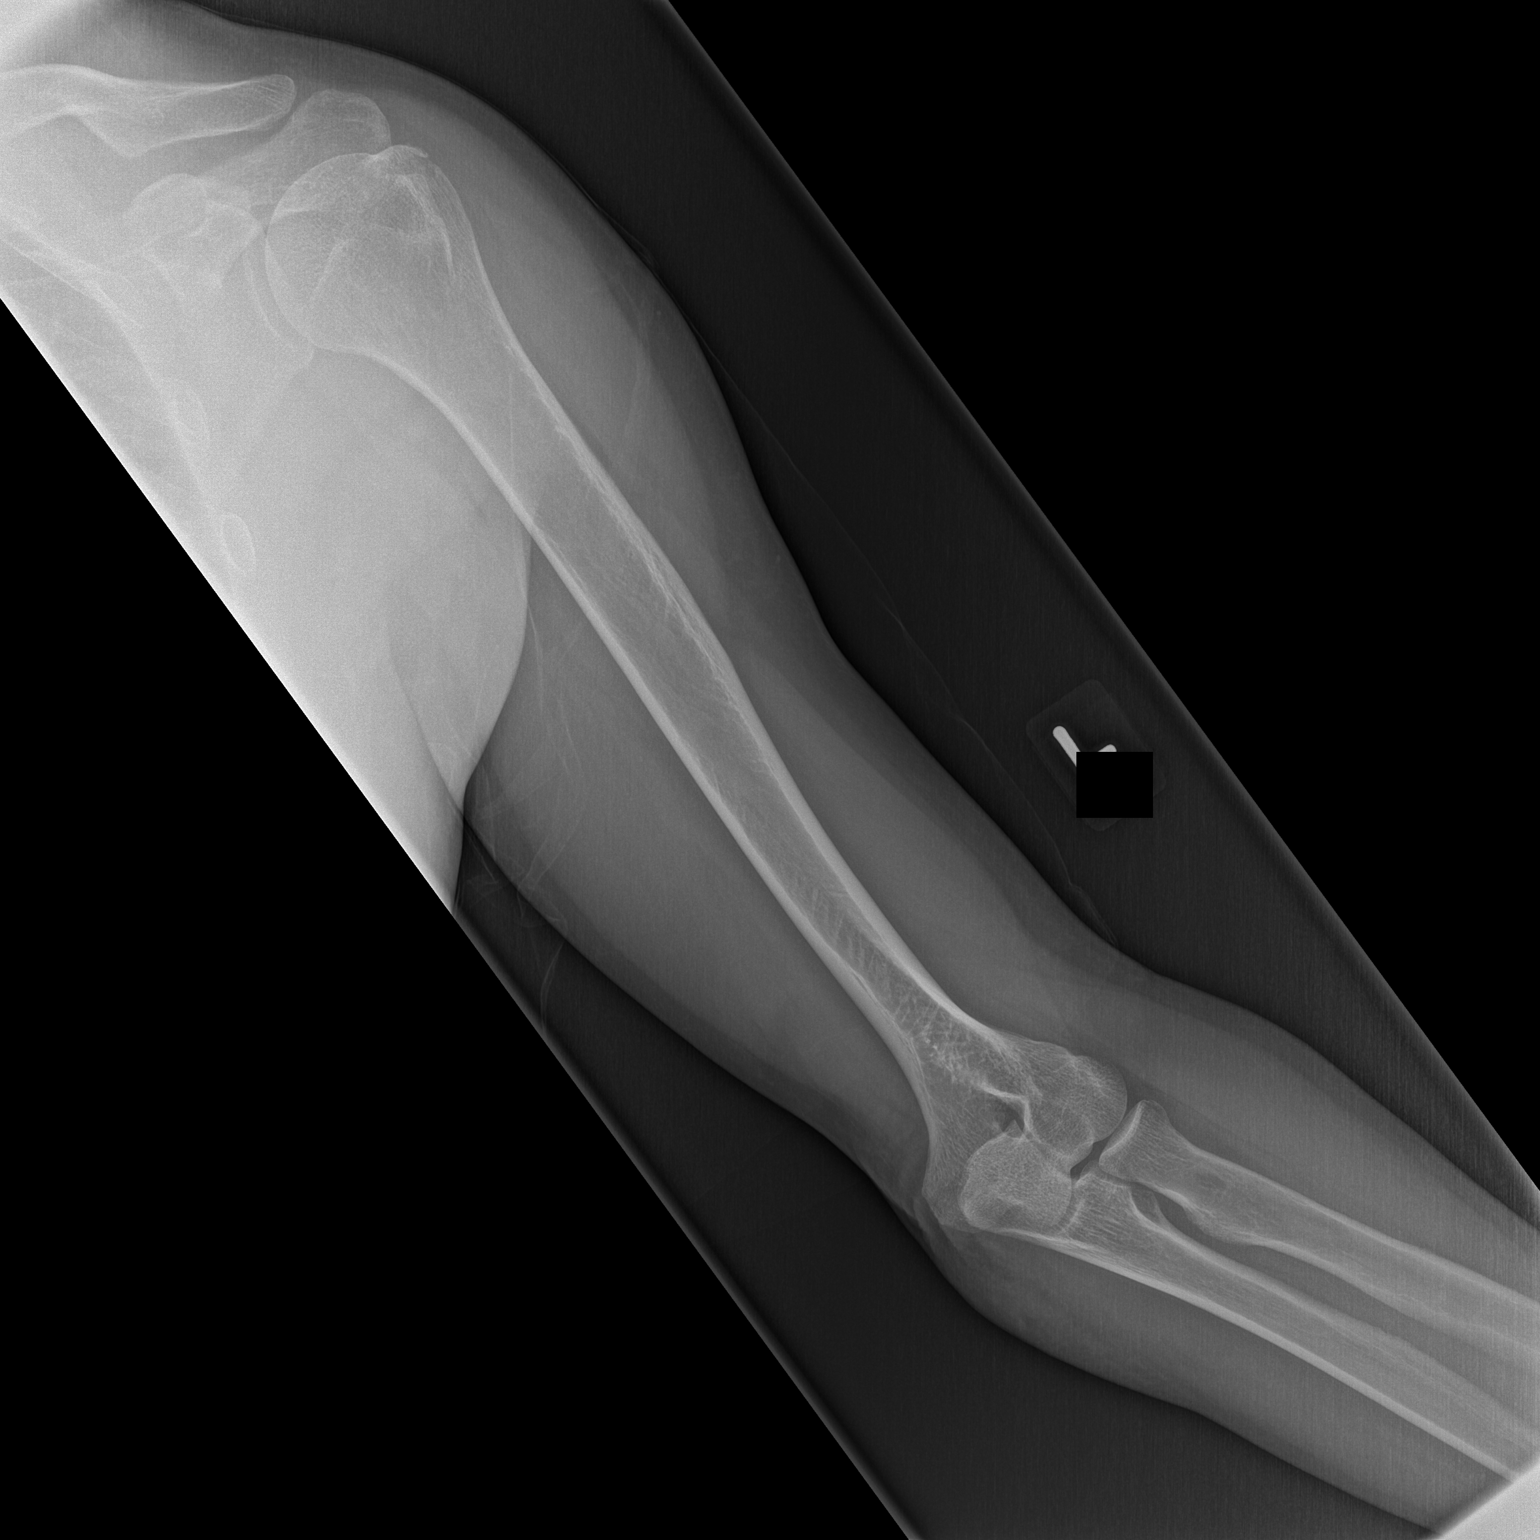
[im 2/2]
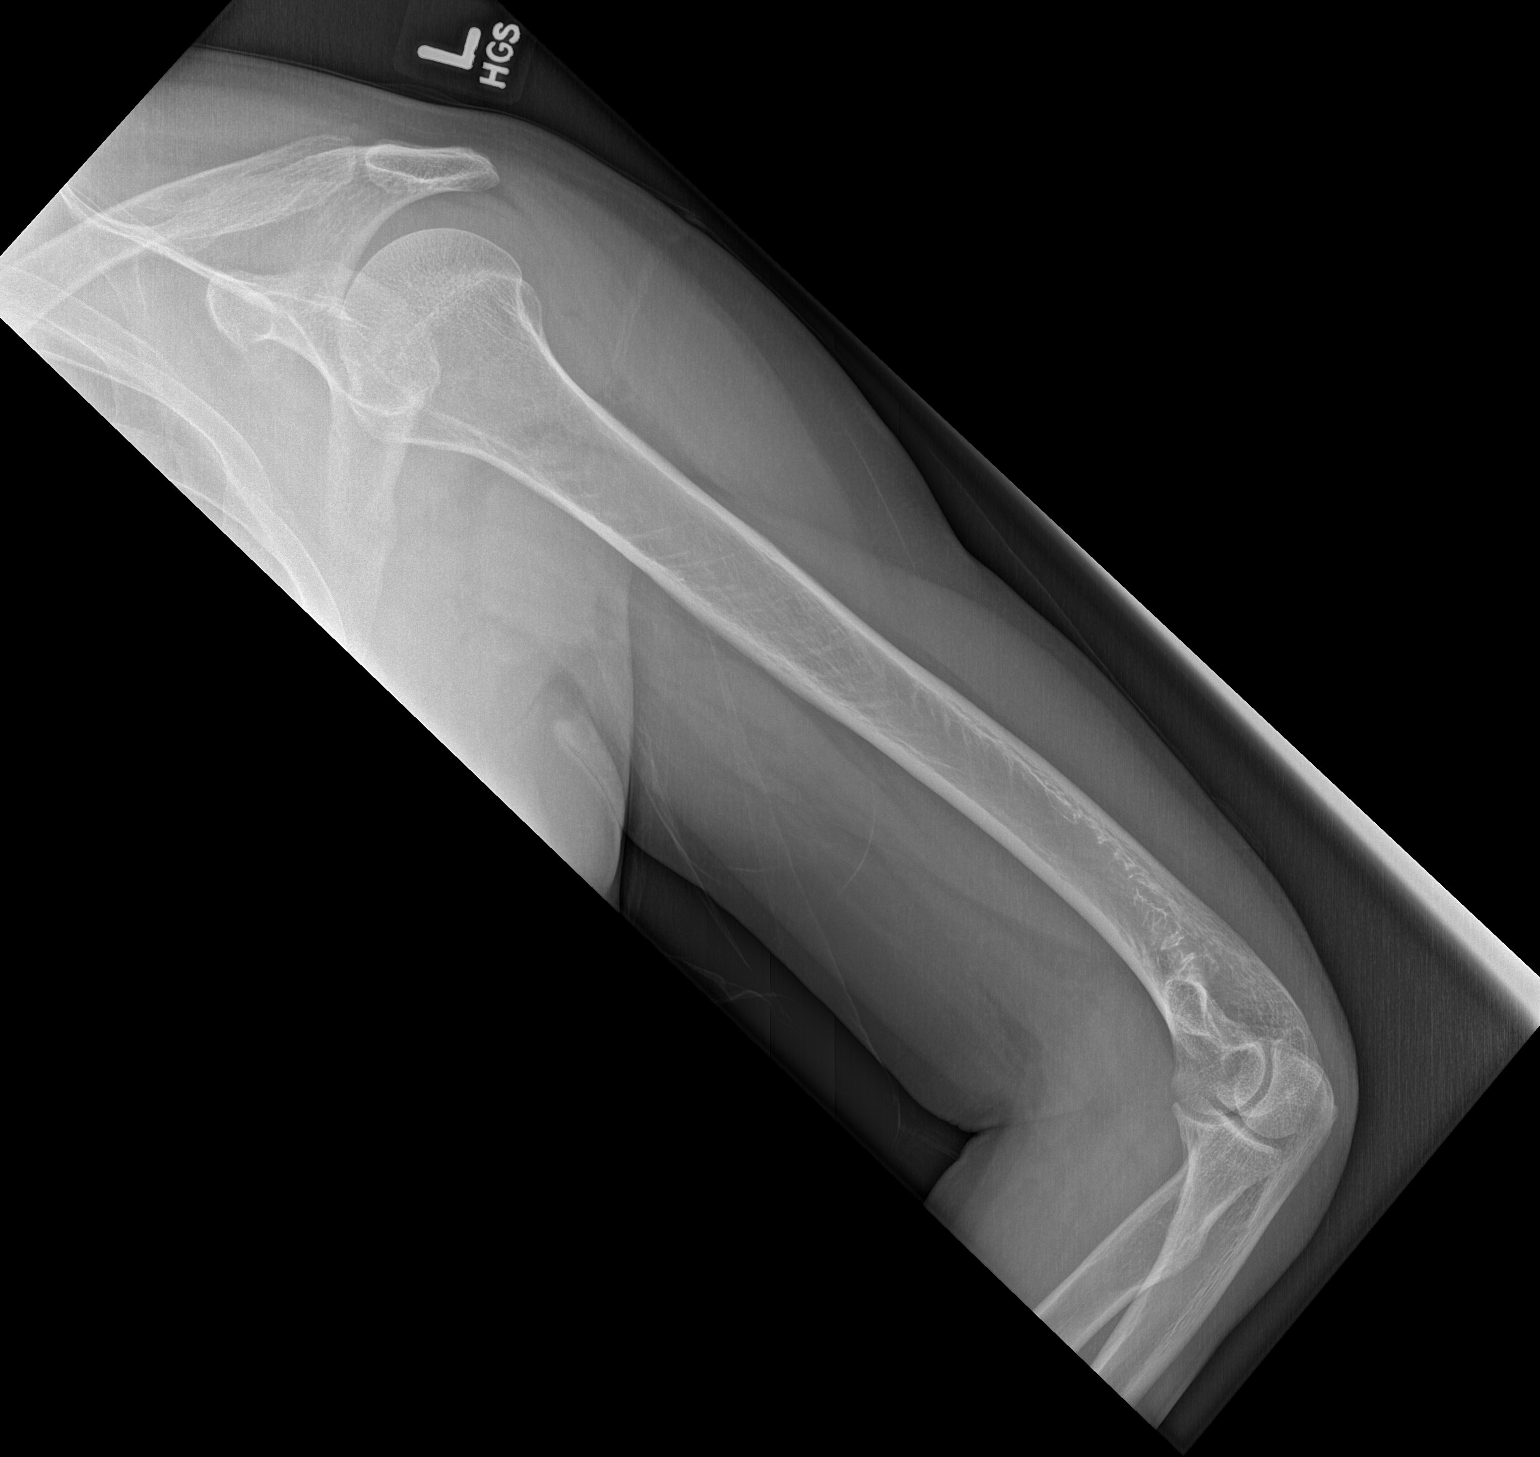

[2 of 2 positions shown; findings below may reference images not displayed]

FINDINGS: There is an abnormal lucency in the RIGHT greater tuberosity that
suggests either an avulsion or minimally displaced impaction of the
greater tuberosity at the rotator cuff footprint. Degenerative
changes of the rotator cuff producing this lucency are unlikely.
Dedicated shoulder radiographs are recommended. This could be
artifactual due to soft tissue overlap and is not seen on the
lateral view. Distal humerus appears normal.
IMPRESSION: Possible minimally displaced fracture the greater tuberosity of the
humerus. Dedicated shoulder films recommended.

## 2016-09-09 IMAGING — CR DG SHOULDER 3+V*L*
1 series · 4 of 4 positions shown · non-contrast
Comparison: None.

CLINICAL DATA: 53-year-old male with a history of avulsion fracture
on proximal humerus.

EXAM:
DG SHOULDER 3+VIEWS LEFT

[Series 3: w shoulder grashey left · 0.14mm/px · 4 of 4 slices shown]
[im 1/4]
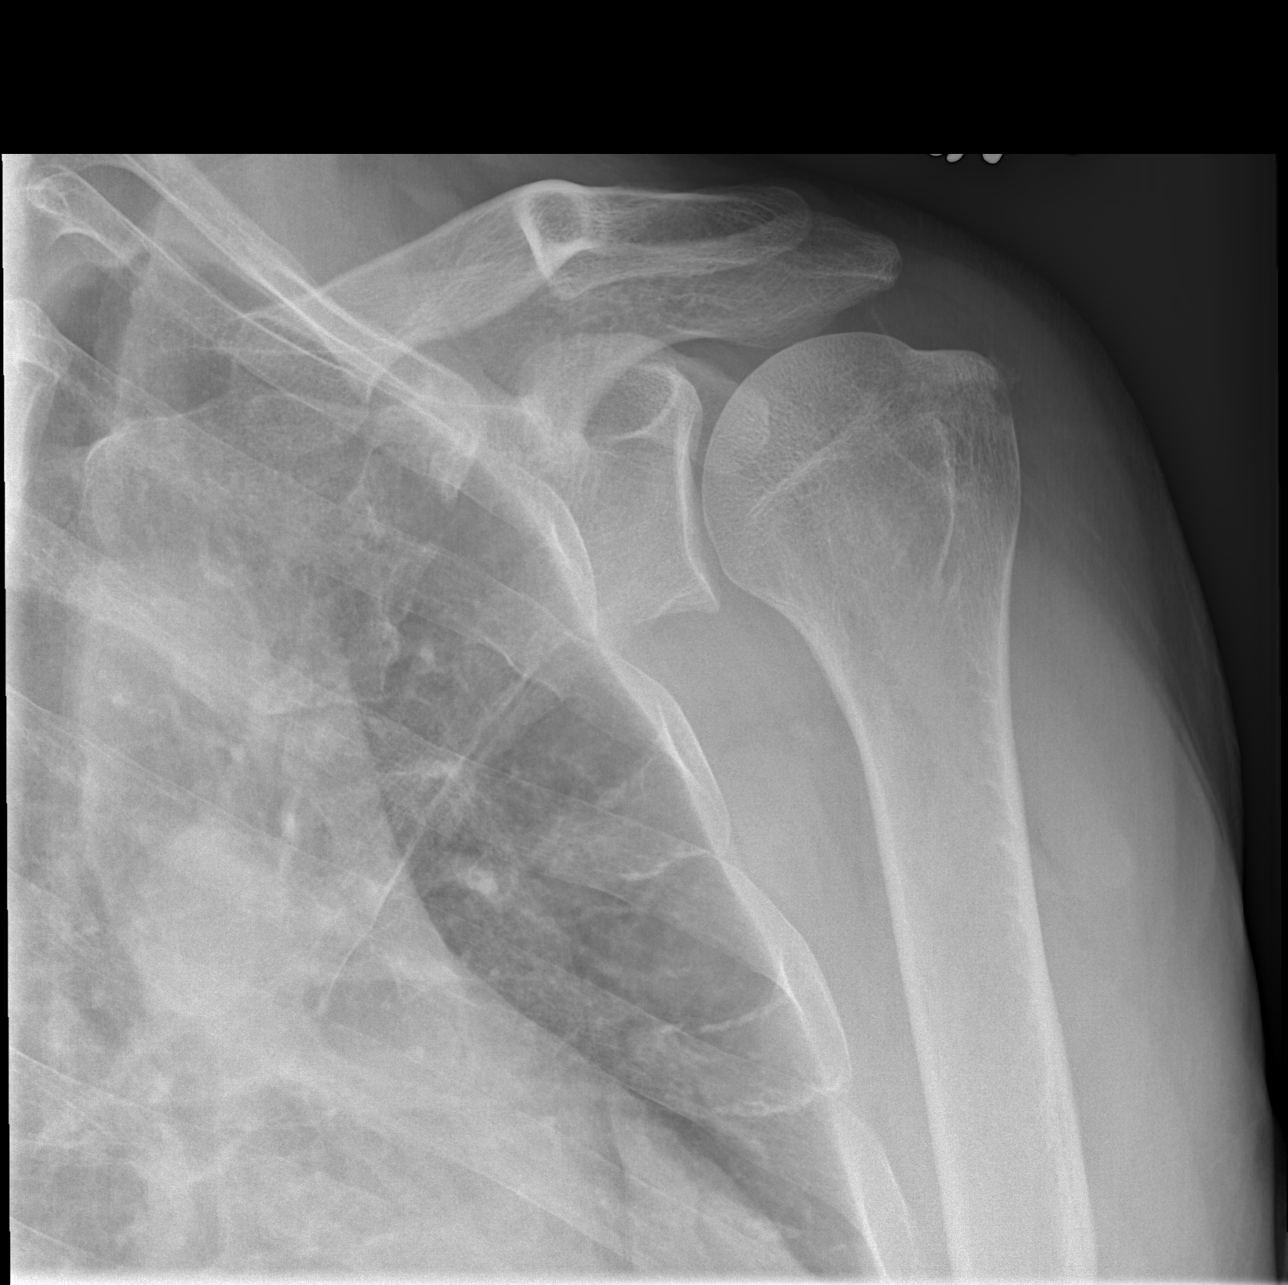
[im 2/4]
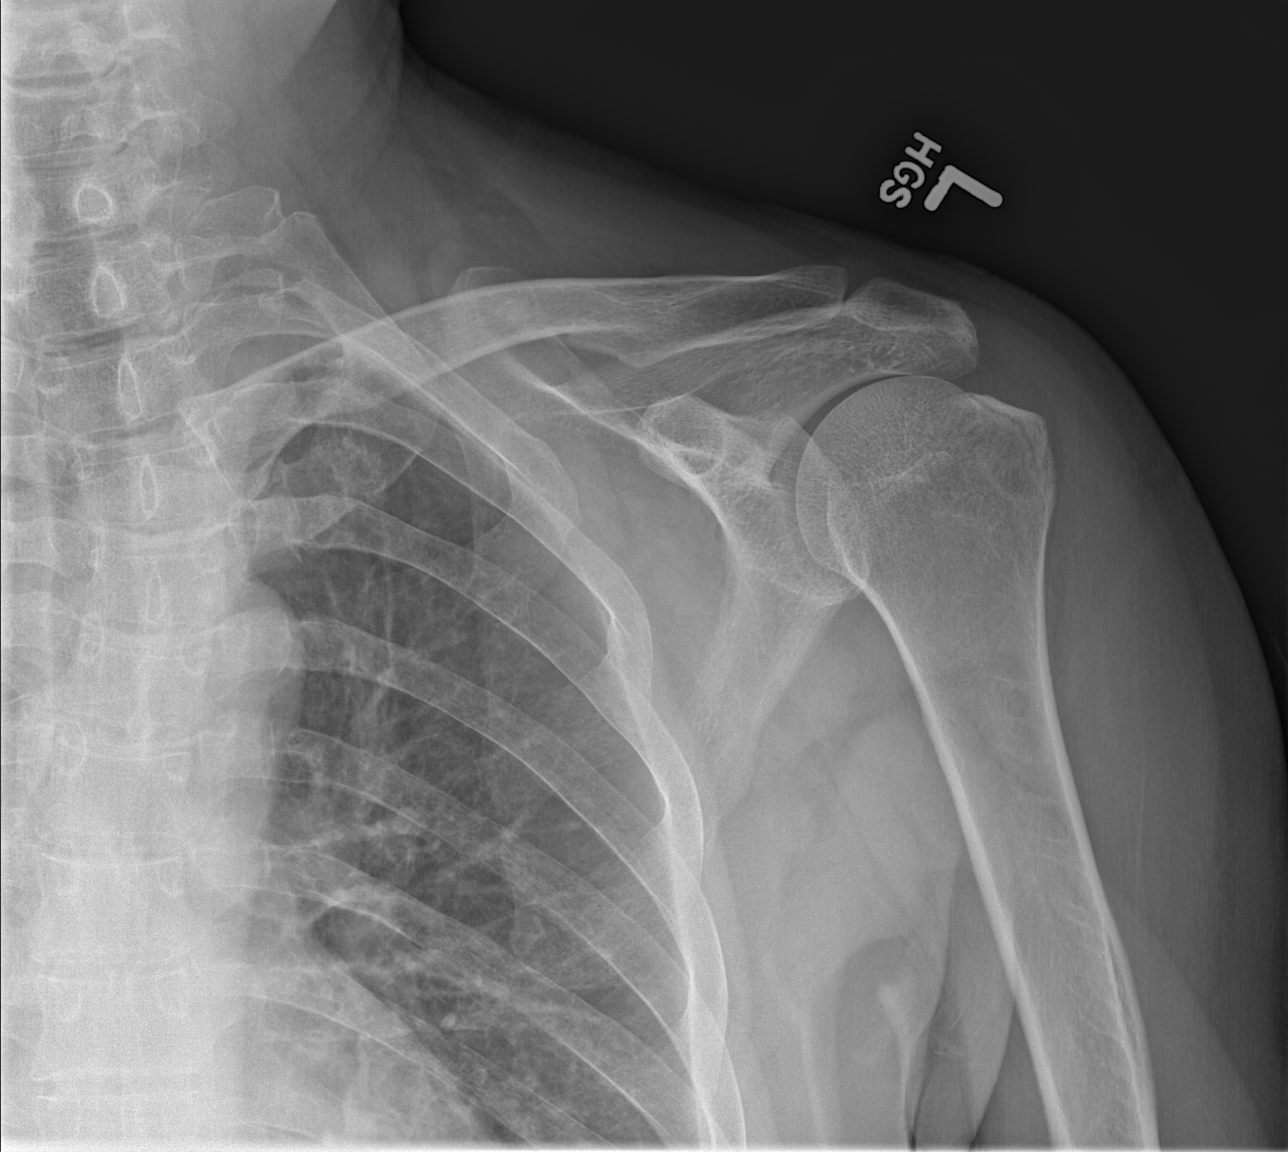
[im 3/4]
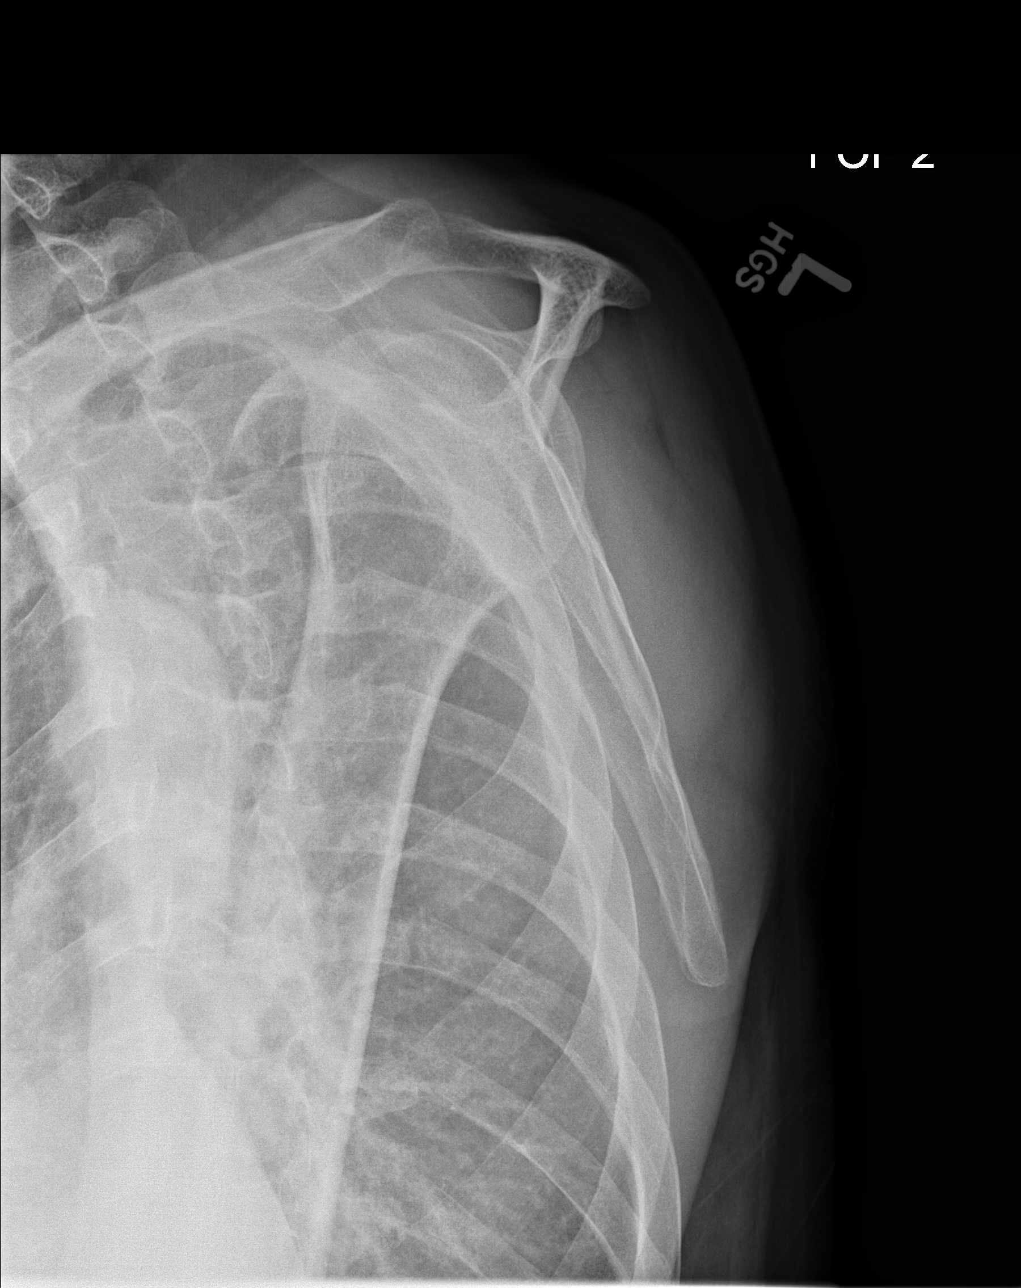
[im 4/4]
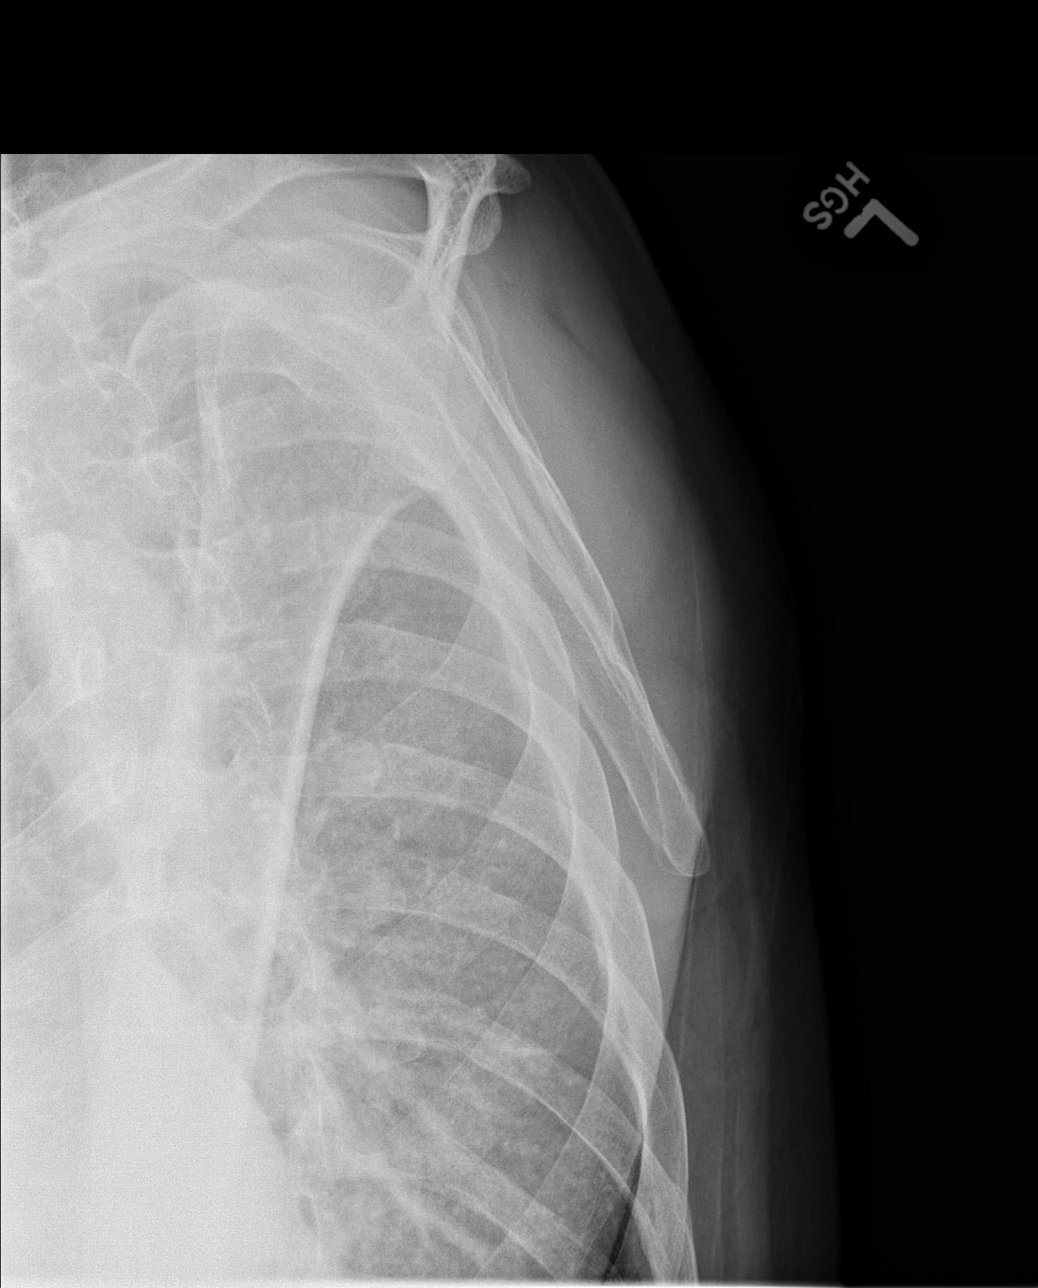

[4 of 4 positions shown; findings below may reference images not displayed]

FINDINGS: Irregularity at the left greater tuberosity of the humerus again
demonstrated. Margin appears well corticated on the anterior view,
with the glenohumeral view demonstrating ill-defined calcific
density.

Glenohumeral joint appears congruent. No evidence of abnormality of
the clavicle.

Unremarkable appearance of the visualized thorax.
IMPRESSION: Irregularity at the greater tuberosity left humerus, concerning for
acute avulsion fracture given the history of fall. If not point
tender at this location, this could represent a more remote injury.

## 2017-05-28 ENCOUNTER — Emergency Department: Payer: Medicaid - Out of State

## 2017-05-28 ENCOUNTER — Encounter: Payer: Self-pay | Admitting: Medical Oncology

## 2017-05-28 ENCOUNTER — Inpatient Hospital Stay
Admission: EM | Admit: 2017-05-28 | Discharge: 2017-05-29 | DRG: 894 | Discharge status: Against Medical Advice | Payer: Medicaid - Out of State | Attending: Internal Medicine | Admitting: Internal Medicine

## 2017-05-28 DIAGNOSIS — R0789 Other chest pain: Secondary | ICD-10-CM | POA: Diagnosis present

## 2017-05-28 DIAGNOSIS — X58XXXA Exposure to other specified factors, initial encounter: Secondary | ICD-10-CM | POA: Diagnosis present

## 2017-05-28 DIAGNOSIS — F10931 Alcohol use, unspecified with withdrawal delirium: Secondary | ICD-10-CM

## 2017-05-28 DIAGNOSIS — F10231 Alcohol dependence with withdrawal delirium: Secondary | ICD-10-CM

## 2017-05-28 DIAGNOSIS — F10939 Alcohol use, unspecified with withdrawal, unspecified: Secondary | ICD-10-CM | POA: Diagnosis present

## 2017-05-28 DIAGNOSIS — Z23 Encounter for immunization: Secondary | ICD-10-CM

## 2017-05-28 DIAGNOSIS — F209 Schizophrenia, unspecified: Secondary | ICD-10-CM | POA: Diagnosis present

## 2017-05-28 DIAGNOSIS — K7010 Alcoholic hepatitis without ascites: Secondary | ICD-10-CM | POA: Diagnosis present

## 2017-05-28 DIAGNOSIS — R079 Chest pain, unspecified: Secondary | ICD-10-CM

## 2017-05-28 DIAGNOSIS — Z888 Allergy status to other drugs, medicaments and biological substances status: Secondary | ICD-10-CM

## 2017-05-28 DIAGNOSIS — Z886 Allergy status to analgesic agent status: Secondary | ICD-10-CM

## 2017-05-28 DIAGNOSIS — F17210 Nicotine dependence, cigarettes, uncomplicated: Secondary | ICD-10-CM | POA: Diagnosis present

## 2017-05-28 DIAGNOSIS — J441 Chronic obstructive pulmonary disease with (acute) exacerbation: Secondary | ICD-10-CM | POA: Diagnosis present

## 2017-05-28 DIAGNOSIS — F10239 Alcohol dependence with withdrawal, unspecified: Secondary | ICD-10-CM | POA: Diagnosis present

## 2017-05-28 DIAGNOSIS — F10230 Alcohol dependence with withdrawal, uncomplicated: Principal | ICD-10-CM | POA: Diagnosis present

## 2017-05-28 DIAGNOSIS — I10 Essential (primary) hypertension: Secondary | ICD-10-CM | POA: Diagnosis present

## 2017-05-28 DIAGNOSIS — T380X6A Underdosing of glucocorticoids and synthetic analogues, initial encounter: Secondary | ICD-10-CM | POA: Diagnosis present

## 2017-05-28 DIAGNOSIS — Z91128 Patient's intentional underdosing of medication regimen for other reason: Secondary | ICD-10-CM

## 2017-05-28 LAB — COMPREHENSIVE METABOLIC PANEL
ALK PHOS: 181 U/L — AB (ref 38–126)
ALT: 116 U/L — AB (ref 17–63)
ANION GAP: 15 (ref 5–15)
AST: 140 U/L — ABNORMAL HIGH (ref 15–41)
Albumin: 4 g/dL (ref 3.5–5.0)
BILIRUBIN TOTAL: 0.8 mg/dL (ref 0.3–1.2)
BUN: 10 mg/dL (ref 6–20)
CALCIUM: 9 mg/dL (ref 8.9–10.3)
CO2: 20 mmol/L — ABNORMAL LOW (ref 22–32)
CREATININE: 0.65 mg/dL (ref 0.61–1.24)
Chloride: 100 mmol/L — ABNORMAL LOW (ref 101–111)
GFR calc Af Amer: >60 mL/min (ref >60)
GFR calc non Af Amer: >60 mL/min (ref >60)
GLUCOSE: 109 mg/dL — AB (ref 65–99)
Potassium: 4.3 mmol/L (ref 3.5–5.1)
Sodium: 135 mmol/L (ref 135–145)
TOTAL PROTEIN: 8.2 g/dL — AB (ref 6.5–8.1)

## 2017-05-28 LAB — TROPONIN I
Troponin I: <0.03 ng/mL (ref <0.03)
Troponin I: <0.03 ng/mL (ref <0.03)

## 2017-05-28 LAB — CBC
HEMATOCRIT: 43.8 % (ref 40.0–52.0)
HEMOGLOBIN: 15.4 g/dL (ref 13.0–18.0)
MCH: 32 pg (ref 26.0–34.0)
MCHC: 35.1 g/dL (ref 32.0–36.0)
MCV: 91.1 fL (ref 80.0–100.0)
Platelets: 278 10*3/uL (ref 150–440)
RBC: 4.8 MIL/uL (ref 4.40–5.90)
RDW: 16.4 % — AB (ref 11.5–14.5)
WBC: 11 10*3/uL — ABNORMAL HIGH (ref 3.8–10.6)

## 2017-05-28 LAB — ETHANOL: Alcohol, Ethyl (B): 248 mg/dL — ABNORMAL HIGH (ref <5)

## 2017-05-28 LAB — MAGNESIUM: Magnesium: 1.6 mg/dL — ABNORMAL LOW (ref 1.7–2.4)

## 2017-05-28 MED ORDER — LORAZEPAM 2 MG/ML IJ SOLN
2.0000 mg | Freq: Once | INTRAMUSCULAR | Status: AC
  Administered 2017-05-28: 2 mg via INTRAVENOUS

## 2017-05-28 MED ORDER — SODIUM CHLORIDE 0.9% FLUSH
3.0000 mL | Freq: Two times a day (BID) | INTRAVENOUS | Status: DC
  Administered 2017-05-28 – 2017-05-29 (×3): 3 mL via INTRAVENOUS

## 2017-05-28 MED ORDER — ONDANSETRON HCL 4 MG/2ML IJ SOLN
4.0000 mg | Freq: Four times a day (QID) | INTRAMUSCULAR | Status: DC | PRN
  Administered 2017-05-28: 4 mg via INTRAVENOUS
  Filled 2017-05-28: qty 2

## 2017-05-28 MED ORDER — METHYLPREDNISOLONE SODIUM SUCC 40 MG IJ SOLR
40.0000 mg | Freq: Two times a day (BID) | INTRAMUSCULAR | Status: DC
  Administered 2017-05-28 – 2017-05-29 (×2): 40 mg via INTRAVENOUS
  Filled 2017-05-28 (×2): qty 1

## 2017-05-28 MED ORDER — LORAZEPAM 2 MG/ML IJ SOLN
0.0000 mg | Freq: Four times a day (QID) | INTRAMUSCULAR | Status: DC
  Administered 2017-05-29 (×2): 2 mg via INTRAVENOUS
  Administered 2017-05-29: 1 mg via INTRAVENOUS
  Filled 2017-05-28 (×3): qty 1

## 2017-05-28 MED ORDER — LORAZEPAM 2 MG/ML IJ SOLN
2.0000 mg | Freq: Once | INTRAMUSCULAR | Status: AC
  Administered 2017-05-28: 2 mg via INTRAVENOUS
  Filled 2017-05-28: qty 1

## 2017-05-28 MED ORDER — SODIUM CHLORIDE 0.9 % IV SOLN
INTRAVENOUS | Status: DC
  Administered 2017-05-28 – 2017-05-29 (×2): via INTRAVENOUS

## 2017-05-28 MED ORDER — THIAMINE HCL 100 MG/ML IJ SOLN
100.0000 mg | Freq: Every day | INTRAMUSCULAR | Status: DC
  Administered 2017-05-29: 100 mg via INTRAVENOUS
  Filled 2017-05-28: qty 2

## 2017-05-28 MED ORDER — LORAZEPAM 2 MG/ML IJ SOLN
INTRAMUSCULAR | Status: AC
  Administered 2017-05-28: 3 mg via INTRAVENOUS
  Filled 2017-05-28: qty 2

## 2017-05-28 MED ORDER — IPRATROPIUM-ALBUTEROL 0.5-2.5 (3) MG/3ML IN SOLN
3.0000 mL | Freq: Three times a day (TID) | RESPIRATORY_TRACT | Status: DC
  Administered 2017-05-29 (×2): 3 mL via RESPIRATORY_TRACT
  Filled 2017-05-28 (×2): qty 3

## 2017-05-28 MED ORDER — LORAZEPAM 2 MG/ML IJ SOLN
3.0000 mg | Freq: Once | INTRAMUSCULAR | Status: AC
  Administered 2017-05-28: 3 mg via INTRAVENOUS

## 2017-05-28 MED ORDER — IPRATROPIUM-ALBUTEROL 0.5-2.5 (3) MG/3ML IN SOLN
3.0000 mL | Freq: Four times a day (QID) | RESPIRATORY_TRACT | Status: DC
  Administered 2017-05-28: 3 mL via RESPIRATORY_TRACT
  Filled 2017-05-28: qty 3

## 2017-05-28 MED ORDER — LORAZEPAM 2 MG PO TABS: 0.0000 mg | ORAL_TABLET | Freq: Four times a day (QID) | ORAL | Status: DC

## 2017-05-28 MED ORDER — ENOXAPARIN SODIUM 40 MG/0.4ML ~~LOC~~ SOLN
40.0000 mg | SUBCUTANEOUS | Status: DC
  Administered 2017-05-28: 40 mg via SUBCUTANEOUS
  Filled 2017-05-28: qty 0.4

## 2017-05-28 MED ORDER — THIAMINE HCL 100 MG/ML IJ SOLN
100.0000 mg | Freq: Once | INTRAMUSCULAR | Status: AC
  Administered 2017-05-28: 100 mg via INTRAVENOUS
  Filled 2017-05-28: qty 2

## 2017-05-28 MED ORDER — IPRATROPIUM-ALBUTEROL 0.5-2.5 (3) MG/3ML IN SOLN
3.0000 mL | Freq: Once | RESPIRATORY_TRACT | Status: AC
  Administered 2017-05-28: 3 mL via RESPIRATORY_TRACT
  Filled 2017-05-28: qty 3

## 2017-05-28 MED ORDER — THIAMINE HCL 100 MG/ML IJ SOLN
100.0000 mg | Freq: Every day | INTRAMUSCULAR | Status: DC
Start: 2017-05-28 — End: 2017-05-28

## 2017-05-28 MED ORDER — ONDANSETRON HCL 4 MG PO TABS: 4.0000 mg | ORAL_TABLET | Freq: Four times a day (QID) | ORAL | Status: DC | PRN

## 2017-05-28 MED ORDER — METHYLPREDNISOLONE SODIUM SUCC 125 MG IJ SOLR
125.0000 mg | INTRAMUSCULAR | Status: AC
  Administered 2017-05-28: 125 mg via INTRAVENOUS
  Filled 2017-05-28: qty 2

## 2017-05-28 MED ORDER — SODIUM CHLORIDE 0.9 % IV SOLN: 250.0000 mL | INTRAVENOUS | Status: DC | PRN

## 2017-05-28 MED ORDER — MAGNESIUM SULFATE 2 GM/50ML IV SOLN
2.0000 g | Freq: Once | INTRAVENOUS | Status: AC
  Administered 2017-05-28: 2 g via INTRAVENOUS
  Filled 2017-05-28: qty 50

## 2017-05-28 MED ORDER — LORAZEPAM 2 MG PO TABS
0.0000 mg | ORAL_TABLET | Freq: Two times a day (BID) | ORAL | Status: DC
Start: 2017-05-30 — End: 2017-05-29

## 2017-05-28 MED ORDER — ADULT MULTIVITAMIN W/MINERALS CH
1.0000 | ORAL_TABLET | Freq: Every day | ORAL | Status: DC
  Administered 2017-05-28 – 2017-05-29 (×2): 1 via ORAL
  Filled 2017-05-28 (×2): qty 1

## 2017-05-28 MED ORDER — NICOTINE 21 MG/24HR TD PT24
21.0000 mg | MEDICATED_PATCH | Freq: Once | TRANSDERMAL | Status: AC
  Administered 2017-05-28: 21 mg via TRANSDERMAL
  Filled 2017-05-28: qty 1

## 2017-05-28 MED ORDER — LORAZEPAM 1 MG PO TABS
1.0000 mg | ORAL_TABLET | Freq: Four times a day (QID) | ORAL | Status: DC | PRN
  Administered 2017-05-28: 1 mg via ORAL
  Filled 2017-05-28: qty 1

## 2017-05-28 MED ORDER — FOLIC ACID 1 MG PO TABS
1.0000 mg | ORAL_TABLET | Freq: Every day | ORAL | Status: DC
  Administered 2017-05-28 – 2017-05-29 (×2): 1 mg via ORAL
  Filled 2017-05-28 (×2): qty 1

## 2017-05-28 MED ORDER — IPRATROPIUM-ALBUTEROL 0.5-2.5 (3) MG/3ML IN SOLN
RESPIRATORY_TRACT | Status: AC
  Filled 2017-05-28: qty 3

## 2017-05-28 MED ORDER — LORAZEPAM 2 MG/ML IJ SOLN
0.0000 mg | Freq: Two times a day (BID) | INTRAMUSCULAR | Status: DC
Start: 2017-05-30 — End: 2017-05-29

## 2017-05-28 MED ORDER — ASPIRIN EC 325 MG PO TBEC
DELAYED_RELEASE_TABLET | ORAL | Status: AC
  Administered 2017-05-28: 325 mg
  Filled 2017-05-28: qty 1

## 2017-05-28 MED ORDER — LORAZEPAM 2 MG/ML IJ SOLN
1.0000 mg | Freq: Once | INTRAMUSCULAR | Status: AC
Start: 2017-05-28 — End: 2017-05-28
  Administered 2017-05-28: 1 mg via INTRAVENOUS
  Filled 2017-05-28: qty 1

## 2017-05-28 MED ORDER — LORAZEPAM 2 MG/ML IJ SOLN
INTRAMUSCULAR | Status: AC
  Filled 2017-05-28: qty 1

## 2017-05-28 MED ORDER — VITAMIN B-1 100 MG PO TABS
100.0000 mg | ORAL_TABLET | Freq: Every day | ORAL | Status: DC
  Administered 2017-05-28: 100 mg via ORAL
  Filled 2017-05-28 (×2): qty 1

## 2017-05-28 MED ORDER — VITAMIN B-1 100 MG PO TABS
100.0000 mg | ORAL_TABLET | Freq: Every day | ORAL | Status: DC
Start: 2017-05-28 — End: 2017-05-28

## 2017-05-28 MED ORDER — INFLUENZA VAC SPLIT QUAD 0.5 ML IM SUSY
0.5000 mL | PREFILLED_SYRINGE | INTRAMUSCULAR | Status: AC
  Administered 2017-05-29: 0.5 mL via INTRAMUSCULAR
  Filled 2017-05-28: qty 0.5

## 2017-05-28 MED ORDER — LISINOPRIL 5 MG PO TABS
5.0000 mg | ORAL_TABLET | Freq: Every day | ORAL | Status: DC
  Administered 2017-05-28 – 2017-05-29 (×2): 5 mg via ORAL
  Filled 2017-05-28 (×2): qty 1

## 2017-05-28 MED ORDER — LORAZEPAM 2 MG/ML IJ SOLN
0.0000 mg | Freq: Four times a day (QID) | INTRAMUSCULAR | Status: DC
  Administered 2017-05-28 (×2): 1 mg via INTRAVENOUS
  Filled 2017-05-28 (×2): qty 1

## 2017-05-28 MED ORDER — GUAIFENESIN ER 600 MG PO TB12
600.0000 mg | ORAL_TABLET | Freq: Two times a day (BID) | ORAL | Status: DC
  Administered 2017-05-28 – 2017-05-29 (×3): 600 mg via ORAL
  Filled 2017-05-28 (×3): qty 1

## 2017-05-28 MED ORDER — THIAMINE HCL 100 MG/ML IJ SOLN
Freq: Once | INTRAVENOUS | Status: AC
  Administered 2017-05-28: 10:00:00 via INTRAVENOUS
  Filled 2017-05-28: qty 1000

## 2017-05-28 MED ORDER — SODIUM CHLORIDE 0.9% FLUSH: 3.0000 mL | INTRAVENOUS | Status: DC | PRN

## 2017-05-28 MED ORDER — LORAZEPAM 2 MG/ML IJ SOLN: 1.0000 mg | Freq: Four times a day (QID) | INTRAMUSCULAR | Status: DC | PRN

## 2017-05-28 NOTE — H&P (Signed)
Jack Lowe is an 56 y.o. male.   Chief Complaint: Shortness of breath and chest pain. HPI: This 56 year old male has a history of heavy alcohol use and COPD. He said he quit drinking yesterday. He started developing some chest pain is worse on deep inspiration. He's also had increased wheezing and cough. Has been coughing stuff up. He ran out his inhalers about a week ago. He started drinking a couple beers today, he felt some bad. He comes in each jittery and has bouts of confusion. He's been given multiple doses of Ativan here in the ER and appears to be more calm but still confused. We'll services were consulted for admission.  Past Medical History:  Diagnosis Date  . Alcohol abuse   . COPD (chronic obstructive pulmonary disease) ([place])   . Hypertension   . Schizophrenia [facility])     Past Surgical History:  Procedure Laterality Date  . COLONOSCOPY      Family History  Problem Relation Age of Onset  . Hyperlipidemia Unknown   . Lung cancer Unknown    Social History:  reports that he has been smoking Cigarettes.  He has been smoking about 2.00 packs per day. He does not have any smokeless tobacco history on file. He reports that he drinks alcohol. He reports that he does not use drugs.  Allergies:  Allergies  Allergen Reactions  . Haldol [Haloperidol] Swelling    Tongue swelling  . Toradol [Ketorolac Tromethamine] Swelling     (Not in a hospital admission)  Results for orders placed or performed during the hospital encounter of 05/28/17 (from the past 48 hour(s))  CBC     Status: Abnormal   Collection Time: 05/28/17  8:57 AM  Result Value Ref Range   WBC 11.0 (H) 3.8 - 10.6 K/uL   RBC 4.80 4.40 - 5.90 MIL/uL   Hemoglobin 15.4 13.0 - 18.0 g/dL   HCT 43.8 40.0 - 52.0 %   MCV 91.1 80.0 - 100.0 fL   MCH 32.0 26.0 - 34.0 pg   MCHC 35.1 32.0 - 36.0 g/dL   RDW 16.4 (H) 11.5 - 14.5 %   Platelets 278 150 - 440 K/uL    Comment: PLATELET COUNT CONFIRMED BY SMEAR  Troponin I      Status: None   Collection Time: 05/28/17  8:57 AM  Result Value Ref Range   Troponin I <0.03 <0.03 ng/mL  Comprehensive metabolic panel     Status: Abnormal   Collection Time: 05/28/17  8:57 AM  Result Value Ref Range   Sodium 135 135 - 145 mmol/L   Potassium 4.3 3.5 - 5.1 mmol/L   Chloride 100 (L) 101 - 111 mmol/L   CO2 20 (L) 22 - 32 mmol/L   Glucose, Bld 109 (H) 65 - 99 mg/dL   BUN 10 6 - 20 mg/dL   Creatinine, Ser 0.65 0.61 - 1.24 mg/dL   Calcium 9.0 8.9 - 10.3 mg/dL   Total Protein 8.2 (H) 6.5 - 8.1 g/dL   Albumin 4.0 3.5 - 5.0 g/dL   AST 140 (H) 15 - 41 U/L   ALT 116 (H) 17 - 63 U/L   Alkaline Phosphatase 181 (H) 38 - 126 U/L   Total Bilirubin 0.8 0.3 - 1.2 mg/dL   GFR calc non Af Amer >60 >60 mL/min   GFR calc Af Amer >60 >60 mL/min    Comment: (NOTE) The eGFR has been calculated using the CKD EPI equation. This calculation has not been validated  in all clinical situations. eGFR's persistently <60 mL/min signify possible Chronic Kidney Disease.    Anion gap 15 5 - 15  Magnesium     Status: Abnormal   Collection Time: 05/28/17  8:57 AM  Result Value Ref Range   Magnesium 1.6 (L) 1.7 - 2.4 mg/dL  Ethanol     Status: Abnormal   Collection Time: 05/28/17  8:57 AM  Result Value Ref Range   Alcohol, Ethyl (B) 248 (H) <5 mg/dL    Comment:        LOWEST DETECTABLE LIMIT FOR SERUM ALCOHOL IS 5 mg/dL FOR MEDICAL PURPOSES ONLY    Dg Chest Portable 1 View  Result Date: 05/28/2017 CLINICAL DATA:  Chest tightness.  Alcohol withdrawal. EXAM: PORTABLE CHEST 1 VIEW COMPARISON:  04/27/2016 FINDINGS: Normal heart size. Stable pointed appearance of the cardiac apex attributed to fat pad. Mild interstitial coarsening that is similar to prior. There is no edema, consolidation, effusion, or pneumothorax. IMPRESSION: No acute finding when compared to prior. Electronically Signed   By: Monte Fantasia M.D.   On: 05/28/2017 09:43    Review of Systems  Constitutional: Negative for  chills and fever.  HENT: Negative for hearing loss.   Eyes: Negative for blurred vision.  Respiratory: Positive for cough, sputum production and wheezing.   Cardiovascular: Positive for chest pain.  Gastrointestinal: Negative for nausea and vomiting.  Genitourinary: Negative for dysuria.  Musculoskeletal: Negative for myalgias.  Skin: Negative for rash.  Psychiatric/Behavioral: Negative for depression.    Blood pressure (!) 136/98, pulse (!) 110, temperature 97.8 F (36.6 C), temperature source Oral, resp. rate 20, height _0  (1.778 m), weight 91.2 kg (201 lb), SpO2 97 %. Physical Exam  Constitutional: He is oriented to person, place, and time.  Well-nourished white male who is jittery and mildly confused.  HENT:  Head: Normocephalic and atraumatic.  Mouth/Throat: Oropharynx is clear and moist. No oropharyngeal exudate.  Eyes: Pupils are equal, round, and reactive to light. EOM are normal. No scleral icterus.  Neck: Normal range of motion. Neck supple. No JVD present. No thyromegaly present.  Cardiovascular: Normal rate and regular rhythm.   No murmur heard. Respiratory:  Mild expiratory wheezing. No use of accessory muscles. No dullness percussion.  GI: Soft. Bowel sounds are normal. He exhibits no distension and no mass. There is no tenderness. There is no rebound and no guarding.  Musculoskeletal: Normal range of motion. He exhibits no edema or tenderness.  Neurological: He is alert and oriented to person, place, and time. No cranial nerve deficit. Coordination normal.  Skin: Skin is warm and dry.     Assessment/Plan 1. Alcohol withdrawal. We'll go ahead and put him on the CIWA protocol. He has had several doses of Ativan here in the ER. If this is not able to control his symptoms he may need to be transferred to the unit for Precedex drip. Continue to monitor and treat for now. 2. Chest pain. Suspect this is noncardiac. However we'll go ahead and put him on telemetry and  cycle his enzymes. Think this is more related to his COPD. 3. COPD exacerbation. He's been out of his inhalers for over a week and continues to smoke. Go ahead and start him on some steroids and nebulizers. 4. Alcoholic hepatitis. Appears to be mild. We'll hydrate him and recheck his liver enzymes in the morning. 4. Hypertension. He has Toprol as a home medication. However with his history of COPD a brother take him off beta blocker  so when start lisinopril. Next line Total time spent 45 minutes  Baxter Hire, MD 05/28/2017, 10:46 AM

## 2017-05-28 NOTE — ED Provider Notes (Signed)
[facility] Emergency Department Provider Note   ____________________________________________   First MD Initiated Contact with Patient 05/28/17 0900     (approximate)  I have reviewed the triage vital signs and the nursing notes.   HISTORY  Chief Complaint Chest Pain and Hallucinations    HPI Jack Lowe is a 56 y.o. male history evaluation of chest tightness.  Patient reports since starting yesterday began feeling tight across his chest, some slight radiation to his left arm. Describes as a moderate to mild discomfort. He reports he's also had wheezing, some slight shortness of breath, and feels like he is having a "COPD" attack.  He also reports that he wasn't feeling well yesterday cut down on his drinking, he normally drinks a large amount of alcohol which she can't very well quantified. Reports he began feeling very shaky, disoriented, and this began hallucinating some today as he has not been able to keep his normal amount of alcohol. No abdominal pain. No vomiting.   Past Medical History:  Diagnosis Date  . Alcohol abuse   . COPD (chronic obstructive pulmonary disease) ([facility])   . Hypertension   . Schizophrenia [facility])     Patient Active Problem List   Diagnosis Date Noted  . COPD (chronic obstructive pulmonary disease) (HCC) 01/10/2016  . Delirium tremens (HCC) 01/09/2016    Past Surgical History:  Procedure Laterality Date  . COLONOSCOPY      Prior to Admission medications   Medication Sig Start Date End Date Taking? Authorizing Provider  beclomethasone (QVAR) 40 MCG/ACT inhaler Inhale 1 puff into the lungs 2 (two) times daily.    [provider]  metoprolol succinate (TOPROL-XL) 25 MG 24 hr tablet Take 25 mg by mouth daily.    [provider]  predniSONE (DELTASONE) 20 MG tablet Take 3 tablets (60 mg total) by mouth daily. 04/28/16   Loleta Rose, MD  tiotropium (SPIRIVA) 18 MCG inhalation capsule Place 18 mcg  into inhaler and inhale daily.    [provider]    Allergies Toradol [ketorolac tromethamine]  Family History  Problem Relation Age of Onset  . Hyperlipidemia Unknown   . Lung cancer Unknown     Social History Social History  Substance Use Topics  . Smoking status: Current Every Day Smoker    Packs/day: 2.00    Types: Cigarettes  . Smokeless tobacco: Not on file  . Alcohol use 0.0 oz/week    Review of Systems Constitutional: No fever/chillsreports he is "in withdrawals" Eyes: No visual changes. ENT: No sore throat. Cardiovascular: the history of present illness Respiratory: Denies shortness of breath. Gastrointestinal: No abdominal pain.  No nausea, no vomiting.  No diarrhea.  No constipation. Genitourinary: Negative for dysuria. Musculoskeletal: Negative for back pain.Tremors in the hands. Skin: Negative for rash.feels sweaty Neurological: Negative for headaches, focal weakness or numbness.    ____________________________________________   PHYSICAL EXAM:  VITAL SIGNS: ED Triage Vitals  Enc Vitals Group     BP 05/28/17 0841 128/74     Pulse Rate 05/28/17 0841 (!) 111     Resp 05/28/17 0841 (!) 24     Temp 05/28/17 0841 97.8 F (36.6 C)     Temp Source 05/28/17 0841 Oral     SpO2 05/28/17 0841 96 %     Weight 05/28/17 0842 201 lb (91.2 kg)     Height 05/28/17 0842  (1.778 m)     Head Circumference --      Peak Flow --  Pain Score 05/28/17 0841 8     Pain Loc --      Pain Edu? --      Excl. in GC? --     Constitutional: Alert and oriented. moderately ill-appearing, slightly diaphoretic and tremulous. He is occasionally moving his arms and picking at things in the air that do not seem to be obviously present Eyes: Conjunctivae are normal. Head: Atraumatic. Nose: No congestion/rhinnorhea. Mouth/Throat: Mucous membranes are moist. Neck: No stridor.   Cardiovascular: slightly tachycardic rate, regular rhythm. Grossly normal heart  sounds.  Good peripheral circulation.  Respiratory: mild use of accessory muscles. Mild and expiratory wheezing throughout. Speaks in phrases. Gastrointestinal: Soft and nontender. No distention. Musculoskeletal: No lower extremity tenderness nor edema. Neurologic:  Normal speech and language. No gross focal neurologic deficits are appreciated. does demonstrate high-frequency tremor in the upper extremities bilateral. Mild mydriasis Skin:  Skin is warm, dry and intact. No rash noted. Psychiatric: Mood and affect are slightly anxious. Speech and behavior are normal.  ____________________________________________   LABS (all labs ordered are listed, but only abnormal results are displayed)  Labs Reviewed  CBC - Abnormal; Notable for the following:       Result Value   WBC 11.0 (*)    RDW 16.4 (*)    All other components within normal limits  COMPREHENSIVE METABOLIC PANEL - Abnormal; Notable for the following:    Chloride 100 (*)    CO2 20 (*)    Glucose, Bld 109 (*)    Total Protein 8.2 (*)    AST 140 (*)    ALT 116 (*)    Alkaline Phosphatase 181 (*)    All other components within normal limits  MAGNESIUM - Abnormal; Notable for the following:    Magnesium 1.6 (*)    All other components within normal limits  ETHANOL - Abnormal; Notable for the following:    Alcohol, Ethyl (B) 248 (*)    All other components within normal limits  TROPONIN I  CBG MONITORING, ED   ____________________________________________  EKG  Reviewed and interpreted by me at 8:40 AM Heart rate 120 QRS 90 QTc 470 Sinus tachycardia, mild artifact is present. No obvious ischemic changes noted. ____________________________________________  RADIOLOGY  CXR no acute  Dg Chest Portable 1 View  Result Date: 05/28/2017 CLINICAL DATA:  Chest tightness.  Alcohol withdrawal. EXAM: PORTABLE CHEST 1 VIEW COMPARISON:  04/27/2016 FINDINGS: Normal heart size. Stable pointed appearance of the cardiac apex  attributed to fat pad. Mild interstitial coarsening that is similar to prior. There is no edema, consolidation, effusion, or pneumothorax. IMPRESSION: No acute finding when compared to prior. Electronically Signed   By: Marnee Spring M.D.   On: 05/28/2017 09:43    ____________________________________________   PROCEDURES  Procedure(s) performed: None  Procedures  Critical Care performed: Yes, see critical care note(s)  CRITICAL CARE Performed by: Sharyn Creamer   Total critical care time: 38 minutes  Critical care time was exclusive of separately billable procedures and treating other patients.  Critical care was necessary to treat or prevent imminent or life-threatening deterioration.  Critical care was time spent personally by me on the following activities: development of treatment plan with patient and/or surrogate as well as nursing, discussions with consultants, evaluation of patient's response to treatment, examination of patient, obtaining history from patient or surrogate, ordering and performing treatments and interventions, ordering and review of laboratory studies, ordering and review of radiographic studies, pulse oximetry and re-evaluation of patient's condition.  ____________________________________________  INITIAL IMPRESSION / ASSESSMENT AND PLAN / ED COURSE  Pertinent labs & imaging results that were available during my care of the patient were reviewed by me and considered in my medical decision making (see chart for details).  Patient presents for evaluation of chest discomfort but also for withdrawal. He is noted to have evidence of alcohol withdrawal with a blood alcohol of about 250. Said previously complicated withdrawals but no history of seizure. He is have some confusion, and I will start him on Ativan IV, withdrawal protocol, also thiamine.  his chest pain suspect is likely due to COPD exacerbation. She was wheezing and mild increased work of breathing.  Chest x-ray is clear. EKG no evidence of ischemia and his troponin is normal.     ----------------------------------------- 9:59 AM on 05/28/2017 ----------------------------------------- patient reports shakes and anxiety is improving. His heart rate and blood pressure improving.  He continues to have mild end expiratory wheezing, with mild use of accessory muscles. We'll give another DuoNeb. He appears to be improving, and reports that both his shortness of breath, chest tightness   as well as shakes and withdrawal or showing signs of improvement.   Based on his elevated withdrawal score of about 19, requested for multiple doses of Ativan despite having an notably elevated alcohol level will admitted to the hospital as I feel she is at high risk for  ____________________________________________   FINAL CLINICAL IMPRESSION(S) / ED DIAGNOSES  Final diagnoses:  Alcohol withdrawal syndrome, with delirium (HCC)  Chest pain, moderate coronary artery risk      NEW MEDICATIONS STARTED DURING THIS VISIT:  New Prescriptions   No medications on file     Note:  This document was prepared using Dragon voice recognition software and may include unintentional dictation errors.     Sharyn Creamer, MD 05/28/17 1002

## 2017-05-28 NOTE — ED Notes (Signed)
Gave patient a food tray 

## 2017-05-28 NOTE — ED Notes (Signed)
Another  ativan given per edp. See MAR.

## 2017-05-28 NOTE — ED Notes (Signed)
Pt appears agitated and restless, continues to try to get up to "walk around and go smoke a cigarette."  Pt redirected, placed back in bed, advised to remain seated.

## 2017-05-28 NOTE — ED Notes (Addendum)
Pt reports binge drinking, drinks three 40oz beers everyday. Pt reports seeing things, pointing at things in the room that aren't there.  Pt states he feels like he is in withdrawals and wants help with his drinking.

## 2017-05-28 NOTE — ED Notes (Signed)
Family at bedside. 

## 2017-05-28 NOTE — ED Triage Notes (Signed)
Pt here with reports of chest pressure that began 2 days ago. Pt also hallucinating in triage, swatting a things that are not present. Pt reports he is an alcoholic and has drank 1 beer this am.

## 2017-05-28 NOTE — Progress Notes (Signed)
Talked to Dr. Letitia Libra about patient's HR still sustaining 120's-130's highest at 136, even patient asleep but arousable. Asked if need to give him medication to control HR, order to start NS IV fluids for possible dehydration. No other concern at the moment. RN will continue to monitor.

## 2017-05-28 NOTE — ED Notes (Signed)
This Clinical research associate spoke with RN for #[id number], [facility] to review chart to possible step down bed placement.

## 2017-05-28 NOTE — ED Notes (Signed)
Pt now resting on stretcher with girlfriend at bedside 

## 2017-05-28 NOTE — ED Notes (Signed)
Family at bedside states pt drinks everyday for five years.

## 2017-05-28 NOTE — ED Notes (Signed)
Pt noted to up walking in the room attached to IV fluids and cardiac monitor. Pt confused and keep saying " I want to go smoke". edp aware, more IV ativan given per md. Pt placed back in stretcher and advised not to get up. Admitting to see pt.

## 2017-05-28 NOTE — ED Notes (Signed)
ED Provider at bedside. 

## 2017-05-28 NOTE — Plan of Care (Signed)
Problem: Safety: Goal: Ability to remain free from injury will improve Outcome: Progressing  Patient's HR low 100's other VSS throughout the shift. Patient on CIWA protocol has been giving scheduled and PRN Ativan. Patient resting well. RN will continue to monitor.

## 2017-05-29 LAB — COMPREHENSIVE METABOLIC PANEL
ALBUMIN: 3.5 g/dL (ref 3.5–5.0)
ALT: 79 U/L — ABNORMAL HIGH (ref 17–63)
AST: 65 U/L — ABNORMAL HIGH (ref 15–41)
Alkaline Phosphatase: 143 U/L — ABNORMAL HIGH (ref 38–126)
Anion gap: 8 (ref 5–15)
BUN: 11 mg/dL (ref 6–20)
CHLORIDE: 101 mmol/L (ref 101–111)
CO2: 27 mmol/L (ref 22–32)
Calcium: 8.9 mg/dL (ref 8.9–10.3)
Creatinine, Ser: 0.61 mg/dL (ref 0.61–1.24)
GFR calc Af Amer: >60 mL/min (ref >60)
GLUCOSE: 170 mg/dL — AB (ref 65–99)
POTASSIUM: 4.3 mmol/L (ref 3.5–5.1)
Sodium: 136 mmol/L (ref 135–145)
Total Bilirubin: 0.9 mg/dL (ref 0.3–1.2)
Total Protein: 7.3 g/dL (ref 6.5–8.1)

## 2017-05-29 LAB — HIV ANTIBODY (ROUTINE TESTING W REFLEX): HIV SCREEN 4TH GENERATION: NONREACTIVE

## 2017-05-29 LAB — MAGNESIUM: MAGNESIUM: 2 mg/dL (ref 1.7–2.4)

## 2017-05-29 LAB — TROPONIN I

## 2017-05-29 MED ORDER — ACETAMINOPHEN 325 MG PO TABS: 650.0000 mg | ORAL_TABLET | Freq: Four times a day (QID) | ORAL | Status: DC | PRN

## 2017-05-29 NOTE — Progress Notes (Addendum)
[employer] [facility] [place] [facility]   PATIENT NAME: Jack Lowe    MR#:  [id number]  DATE OF BIRTH:  1961/05/17  SUBJECTIVE:  CHIEF COMPLAINT:   Chief Complaint  Patient presents with  . Chest Pain  . Hallucinations   The patient has anxiety, tachycardia at 110's and tremor. REVIEW OF SYSTEMS:  Review of Systems  Constitutional: Negative for chills, fever and malaise/fatigue.  HENT: Negative for sore throat.   Eyes: Negative for blurred vision and double vision.  Respiratory: Negative for cough, hemoptysis, shortness of breath, wheezing and stridor.   Cardiovascular: Negative for chest pain, palpitations, orthopnea and leg swelling.  Gastrointestinal: Negative for abdominal pain, blood in stool, diarrhea, melena, nausea and vomiting.  Genitourinary: Negative for dysuria, flank pain and hematuria.  Musculoskeletal: Negative for back pain and joint pain.  Skin: Negative for rash.  Neurological: Positive for tremors. Negative for dizziness, sensory change, focal weakness, seizures, loss of consciousness, weakness and headaches.  Endo/Heme/Allergies: Negative for polydipsia.  Psychiatric/Behavioral: Negative for depression. The patient is nervous/anxious.     DRUG ALLERGIES:   Allergies  Allergen Reactions  . Haldol [Haloperidol] Swelling    Tongue swelling  . Toradol [Ketorolac Tromethamine] Swelling   VITALS:  Blood pressure (!) 156/82, pulse (!) 102, temperature 98 F (36.7 C), temperature source Oral, resp. rate 20, height  (1.778 m), weight 185 lb 12.8 oz (84.3 kg), SpO2 95 %. PHYSICAL EXAMINATION:  Physical Exam  Constitutional: He is oriented to person, place, and time and well-developed, well-nourished, and in no distress.  HENT:  Head: Normocephalic.  Mouth/Throat: Oropharynx is clear and moist.  Eyes: Pupils are equal, round, and reactive to light. Conjunctivae and EOM are normal. No scleral icterus.  Neck: Normal range of motion.  Neck supple. No JVD present. No tracheal deviation present.  Cardiovascular: Normal heart sounds.  Exam reveals no gallop.   No murmur heard. tachycardia  Pulmonary/Chest: Effort normal and breath sounds normal. No respiratory distress. He has no wheezes. He has no rales.  Abdominal: Soft. Bowel sounds are normal. He exhibits no distension. There is no tenderness. There is no rebound.  Musculoskeletal: Normal range of motion. He exhibits no edema or tenderness.  Neurological: He is alert and oriented to person, place, and time. No cranial nerve deficit.  Tremor of both arms and hands  Skin: No rash noted. No erythema.  Psychiatric: Affect normal.   LABORATORY PANEL:  Male CBC  Recent Labs Lab 05/28/17 0857  WBC 11.0*  HGB 15.4  HCT 43.8  PLT 278   ------------------------------------------------------------------------------------------------------------------ Chemistries   Recent Labs Lab 05/29/17 0050  NA 136  K 4.3  CL 101  CO2 27  GLUCOSE 170*  BUN 11  CREATININE 0.61  CALCIUM 8.9  MG 2.0  AST 65*  ALT 79*  ALKPHOS 143*  BILITOT 0.9   RADIOLOGY:  No results found. ASSESSMENT AND PLAN:   1. Alcohol withdrawal. He was on the CIWAprotocol. He has had several doses of Ativan here in the ER and medical floor.  He still has tachycardia, anxiety and tremor.   2. Chest pain. Suspect this is noncardiac. related to his COPD. 3. COPD exacerbation. He's been out of his inhalers for over a week and continues to smoke. He is treated with steroids and nebulizers. 4. Alcoholic hepatitis. Appears to be mild.  5. Hypertension. started lisinopril.  Tobacco abuse. Smoking cessation was counseled for 4 minutes. The patient insisted going home today. I discussed  with him about high risk for death and readmission.  He left AMA. All the records are reviewed and case discussed with Care Management/Social Worker. Management plans discussed with the patient, family and they are  in agreement.  CODE STATUS: Full Code  TOTAL TIME TAKING CARE OF THIS PATIENT: 46 minutes.   More than 50% of the time was spent in counseling/coordination of care: YES  POSSIBLE D/C IN 2 DAYS, DEPENDING ON CLINICAL CONDITION.   Shaune Pollack M.D on 05/29/2017 at 4:43 PM  Between 7am to 6pm - Pager - [phone]  After 6pm go to [email or web address] - Therapist, nutritional Hospitalists

## 2017-05-29 NOTE — Progress Notes (Signed)
Patient left the facility against medical advice.MD spoke to patient earlier that he is not stable enough to be discharged.Patient later insist that he has to leave as he has important things to do.Consequences of discharging himself against medical advice explained and well understood.Some tremors noticed before patient left. Chiropodist notified

## 2017-05-29 NOTE — Discharge Summary (Signed)
   [facility] [place] at [facility]   PATIENT NAME: Jack Lowe    MR#:  [id number]  DATE OF BIRTH:  16-Jun-1961  DATE OF ADMISSION:  05/28/2017   ADMITTING PHYSICIAN: Gracelyn Nurse, MD  DATE OF DISCHARGE: 05/29/2017  3:38 PM  PRIMARY CARE PHYSICIAN: System, Pcp Not In   ADMISSION DIAGNOSIS:  Chest pain, moderate coronary artery risk [R07.9] Alcohol withdrawal syndrome, with delirium (HCC) [F10.231] DISCHARGE DIAGNOSIS:  Active Problems:   Alcohol withdrawal (HCC)  SECONDARY DIAGNOSIS:   Past Medical History:  Diagnosis Date  . Alcohol abuse   . COPD (chronic obstructive pulmonary disease) (HCC)   . Hypertension   . Schizophrenia (HCC)    HOSPITAL COURSE:   1. Alcohol withdrawal. He was on the CIWAprotocol. He has had several doses of Ativan here in the ER and medical floor.  2. Chest pain. Suspect this is noncardiac. related to his COPD. 3. COPD exacerbation. He's been out of his inhalers for over a week and continues to smoke. He is treated with steroids and nebulizers. 4. Alcoholic hepatitis. Appears to be mild.  5. Hypertension. started lisinopril.  Tobacco abuse. Smoking cessation was counseled for 4 minutes. The patient insisted going home today. I discussed with him about high risk for death and readmission.  He left AMA.  Shaune Pollack M.D on 05/29/2017 at 4:50 PM  Between 7am to 6pm - Pager - [phone]  After 6pm go to [email or web address] - Social research officer, government  [facility] [place] [facility]  Office  [phone]  CC: Primary care physician; System, Pcp Not In   Note: This dictation was prepared with Dragon dictation along with smaller phrase technology. Any transcriptional errors that result from this process are unintentional.

## 2017-06-25 ENCOUNTER — Emergency Department
Admission: EM | Admit: 2017-06-25 | Discharge: 2017-06-25 | Disposition: A | Discharge status: Home / Self Care | Payer: Medicaid - Out of State | Attending: Emergency Medicine | Admitting: Emergency Medicine

## 2017-06-25 ENCOUNTER — Encounter: Payer: Self-pay | Admitting: *Deleted

## 2017-06-25 DIAGNOSIS — M25562 Pain in left knee: Secondary | ICD-10-CM | POA: Diagnosis present

## 2017-06-25 DIAGNOSIS — Z5321 Procedure and treatment not carried out due to patient leaving prior to being seen by health care provider: Secondary | ICD-10-CM | POA: Diagnosis not present

## 2017-06-25 NOTE — ED Triage Notes (Signed)
Pt reports left knee pain, pt drinks 2 quarts of liquor daily

## 2017-09-23 ENCOUNTER — Inpatient Hospital Stay
Admission: EM | Admit: 2017-09-23 | Discharge: 2017-09-24 | DRG: 894 | Discharge status: Against Medical Advice | Payer: Medicaid - Out of State | Attending: Internal Medicine | Admitting: Internal Medicine

## 2017-09-23 ENCOUNTER — Other Ambulatory Visit: Payer: Self-pay

## 2017-09-23 ENCOUNTER — Emergency Department: Payer: Medicaid - Out of State

## 2017-09-23 ENCOUNTER — Encounter: Payer: Self-pay | Admitting: Emergency Medicine

## 2017-09-23 DIAGNOSIS — F209 Schizophrenia, unspecified: Secondary | ICD-10-CM | POA: Diagnosis present

## 2017-09-23 DIAGNOSIS — J449 Chronic obstructive pulmonary disease, unspecified: Secondary | ICD-10-CM | POA: Diagnosis present

## 2017-09-23 DIAGNOSIS — F17210 Nicotine dependence, cigarettes, uncomplicated: Secondary | ICD-10-CM | POA: Diagnosis present

## 2017-09-23 DIAGNOSIS — F1010 Alcohol abuse, uncomplicated: Secondary | ICD-10-CM | POA: Diagnosis not present

## 2017-09-23 DIAGNOSIS — I10 Essential (primary) hypertension: Secondary | ICD-10-CM | POA: Diagnosis present

## 2017-09-23 DIAGNOSIS — F10931 Alcohol use, unspecified with withdrawal delirium: Secondary | ICD-10-CM | POA: Diagnosis present

## 2017-09-23 DIAGNOSIS — F10230 Alcohol dependence with withdrawal, uncomplicated: Secondary | ICD-10-CM

## 2017-09-23 DIAGNOSIS — F10231 Alcohol dependence with withdrawal delirium: Secondary | ICD-10-CM | POA: Diagnosis present

## 2017-09-23 DIAGNOSIS — E872 Acidosis: Secondary | ICD-10-CM | POA: Diagnosis present

## 2017-09-23 DIAGNOSIS — F10930 Alcohol use, unspecified with withdrawal, uncomplicated: Secondary | ICD-10-CM

## 2017-09-23 DIAGNOSIS — R112 Nausea with vomiting, unspecified: Secondary | ICD-10-CM | POA: Diagnosis not present

## 2017-09-23 LAB — URINALYSIS, COMPLETE (UACMP) WITH MICROSCOPIC
BACTERIA UA: NONE SEEN
BILIRUBIN URINE: NEGATIVE
Glucose, UA: 50 mg/dL — AB
Hgb urine dipstick: NEGATIVE
Ketones, ur: NEGATIVE mg/dL
LEUKOCYTES UA: NEGATIVE
Nitrite: NEGATIVE
PH: 5 (ref 5.0–8.0)
PROTEIN: 100 mg/dL — AB
Specific Gravity, Urine: 1.01 (ref 1.005–1.030)

## 2017-09-23 LAB — CBC WITH DIFFERENTIAL/PLATELET
Basophils Absolute: 0.2 10*3/uL — ABNORMAL HIGH (ref 0–0.1)
Basophils Relative: 2 %
Eosinophils Absolute: 0.1 10*3/uL (ref 0–0.7)
Eosinophils Relative: 1 %
HEMATOCRIT: 44.7 % (ref 40.0–52.0)
HEMOGLOBIN: 15.3 g/dL (ref 13.0–18.0)
LYMPHS PCT: 18 %
Lymphs Abs: 1.8 10*3/uL (ref 1.0–3.6)
MCH: 32.5 pg (ref 26.0–34.0)
MCHC: 34.3 g/dL (ref 32.0–36.0)
MCV: 94.8 fL (ref 80.0–100.0)
MONO ABS: 0.7 10*3/uL (ref 0.2–1.0)
MONOS PCT: 8 %
NEUTROS ABS: 7 10*3/uL — AB (ref 1.4–6.5)
NEUTROS PCT: 71 %
Platelets: 247 10*3/uL (ref 150–440)
RBC: 4.71 MIL/uL (ref 4.40–5.90)
RDW: 16.4 % — AB (ref 11.5–14.5)
WBC: 9.7 10*3/uL (ref 3.8–10.6)

## 2017-09-23 LAB — LIPASE, BLOOD: LIPASE: 43 U/L (ref 11–51)

## 2017-09-23 LAB — ETHANOL: ALCOHOL ETHYL (B): 223 mg/dL — AB (ref <10)

## 2017-09-23 LAB — COMPREHENSIVE METABOLIC PANEL
ALK PHOS: 179 U/L — AB (ref 38–126)
ALT: 58 U/L (ref 17–63)
AST: 110 U/L — ABNORMAL HIGH (ref 15–41)
Albumin: 3.9 g/dL (ref 3.5–5.0)
Anion gap: 16 — ABNORMAL HIGH (ref 5–15)
BUN: 6 mg/dL (ref 6–20)
CALCIUM: 8.9 mg/dL (ref 8.9–10.3)
CO2: 23 mmol/L (ref 22–32)
CREATININE: 0.68 mg/dL (ref 0.61–1.24)
Chloride: 99 mmol/L — ABNORMAL LOW (ref 101–111)
Glucose, Bld: 128 mg/dL — ABNORMAL HIGH (ref 65–99)
Potassium: 4.3 mmol/L (ref 3.5–5.1)
Sodium: 138 mmol/L (ref 135–145)
TOTAL PROTEIN: 7.9 g/dL (ref 6.5–8.1)
Total Bilirubin: 1 mg/dL (ref 0.3–1.2)

## 2017-09-23 LAB — LACTIC ACID, PLASMA
Lactic Acid, Venous: 2 mmol/L (ref 0.5–1.9)
Lactic Acid, Venous: 2.5 mmol/L (ref 0.5–1.9)

## 2017-09-23 LAB — MAGNESIUM: MAGNESIUM: 1.7 mg/dL (ref 1.7–2.4)

## 2017-09-23 LAB — URINE DRUG SCREEN, QUALITATIVE (ARMC ONLY)
Amphetamines, Ur Screen: NOT DETECTED
BARBITURATES, UR SCREEN: NOT DETECTED
Benzodiazepine, Ur Scrn: POSITIVE — AB
CANNABINOID 50 NG, UR ~~LOC~~: NOT DETECTED
COCAINE METABOLITE, UR ~~LOC~~: NOT DETECTED
MDMA (Ecstasy)Ur Screen: NOT DETECTED
Methadone Scn, Ur: NOT DETECTED
OPIATE, UR SCREEN: NOT DETECTED
PHENCYCLIDINE (PCP) UR S: NOT DETECTED
TRICYCLIC, UR SCREEN: NOT DETECTED

## 2017-09-23 LAB — MRSA PCR SCREENING: MRSA by PCR: NEGATIVE

## 2017-09-23 LAB — GLUCOSE, CAPILLARY: Glucose-Capillary: 96 mg/dL (ref 65–99)

## 2017-09-23 LAB — ACETAMINOPHEN LEVEL: Acetaminophen (Tylenol), Serum: <10 ug/mL — ABNORMAL LOW (ref 10–30)

## 2017-09-23 LAB — TROPONIN I: Troponin I: <0.03 ng/mL (ref <0.03)

## 2017-09-23 LAB — SALICYLATE LEVEL: Salicylate Lvl: <7 mg/dL (ref 2.8–30.0)

## 2017-09-23 MED ORDER — FOLIC ACID 5 MG/ML IJ SOLN
1.0000 mg | Freq: Every day | INTRAMUSCULAR | Status: DC
  Administered 2017-09-23 – 2017-09-24 (×2): 1 mg via INTRAVENOUS
  Filled 2017-09-23 (×2): qty 0.2

## 2017-09-23 MED ORDER — LORAZEPAM 2 MG/ML IJ SOLN
2.0000 mg | INTRAMUSCULAR | Status: DC | PRN
  Filled 2017-09-23: qty 1

## 2017-09-23 MED ORDER — BISACODYL 10 MG RE SUPP
10.0000 mg | Freq: Every day | RECTAL | Status: DC | PRN
Start: 2017-09-23 — End: 2017-09-24

## 2017-09-23 MED ORDER — ONDANSETRON HCL 4 MG/2ML IJ SOLN
4.0000 mg | Freq: Four times a day (QID) | INTRAMUSCULAR | Status: DC | PRN
Start: 2017-09-23 — End: 2017-09-24

## 2017-09-23 MED ORDER — ORAL CARE MOUTH RINSE
15.0000 mL | Freq: Two times a day (BID) | OROMUCOSAL | Status: DC
  Administered 2017-09-23 – 2017-09-24 (×2): 15 mL via OROMUCOSAL

## 2017-09-23 MED ORDER — THIAMINE HCL 100 MG/ML IJ SOLN
Freq: Once | INTRAVENOUS | Status: AC
  Administered 2017-09-23: 14:00:00 via INTRAVENOUS
  Filled 2017-09-23: qty 1000

## 2017-09-23 MED ORDER — NICOTINE 21 MG/24HR TD PT24
MEDICATED_PATCH | TRANSDERMAL | Status: AC
  Administered 2017-09-23: 21 mg via TRANSDERMAL
  Filled 2017-09-23: qty 1

## 2017-09-23 MED ORDER — ACETAMINOPHEN 650 MG RE SUPP: 650.0000 mg | Freq: Four times a day (QID) | RECTAL | Status: DC | PRN

## 2017-09-23 MED ORDER — THIAMINE HCL 100 MG/ML IJ SOLN
100.0000 mg | Freq: Every day | INTRAMUSCULAR | Status: DC
  Administered 2017-09-23 – 2017-09-24 (×2): 100 mg via INTRAVENOUS
  Filled 2017-09-23 (×2): qty 2

## 2017-09-23 MED ORDER — METOPROLOL TARTRATE 50 MG PO TABS
50.0000 mg | ORAL_TABLET | Freq: Two times a day (BID) | ORAL | Status: DC
  Administered 2017-09-23 – 2017-09-24 (×3): 50 mg via ORAL
  Filled 2017-09-23 (×3): qty 1

## 2017-09-23 MED ORDER — LORAZEPAM 2 MG/ML IJ SOLN
2.0000 mg | Freq: Once | INTRAMUSCULAR | Status: AC
  Administered 2017-09-23: 2 mg via INTRAVENOUS

## 2017-09-23 MED ORDER — MAGNESIUM SULFATE 2 GM/50ML IV SOLN
2.0000 g | Freq: Once | INTRAVENOUS | Status: AC
  Administered 2017-09-23: 2 g via INTRAVENOUS
  Filled 2017-09-23: qty 50

## 2017-09-23 MED ORDER — NICOTINE 21 MG/24HR TD PT24
21.0000 mg | MEDICATED_PATCH | Freq: Once | TRANSDERMAL | Status: AC
  Administered 2017-09-23: 21 mg via TRANSDERMAL

## 2017-09-23 MED ORDER — ENOXAPARIN SODIUM 40 MG/0.4ML ~~LOC~~ SOLN
40.0000 mg | SUBCUTANEOUS | Status: DC
  Administered 2017-09-23 – 2017-09-24 (×2): 40 mg via SUBCUTANEOUS
  Filled 2017-09-23 (×2): qty 0.4

## 2017-09-23 MED ORDER — LORAZEPAM 2 MG/ML IJ SOLN
INTRAMUSCULAR | Status: AC
  Administered 2017-09-23: 2 mg via INTRAVENOUS
  Filled 2017-09-23: qty 1

## 2017-09-23 MED ORDER — ONDANSETRON HCL 4 MG PO TABS: 4.0000 mg | ORAL_TABLET | Freq: Four times a day (QID) | ORAL | Status: DC | PRN

## 2017-09-23 MED ORDER — SODIUM CHLORIDE 0.9 % IV BOLUS (SEPSIS)
1000.0000 mL | Freq: Once | INTRAVENOUS | Status: AC
  Administered 2017-09-23: 1000 mL via INTRAVENOUS

## 2017-09-23 MED ORDER — SODIUM CHLORIDE 0.9% FLUSH
3.0000 mL | Freq: Two times a day (BID) | INTRAVENOUS | Status: DC
  Administered 2017-09-23 – 2017-09-24 (×3): 3 mL via INTRAVENOUS

## 2017-09-23 MED ORDER — POLYETHYLENE GLYCOL 3350 17 G PO PACK: 17.0000 g | PACK | Freq: Every day | ORAL | Status: DC | PRN

## 2017-09-23 MED ORDER — NICOTINE 14 MG/24HR TD PT24
14.0000 mg | MEDICATED_PATCH | Freq: Every day | TRANSDERMAL | Status: DC
  Administered 2017-09-24: 14 mg via TRANSDERMAL
  Filled 2017-09-23: qty 1

## 2017-09-23 MED ORDER — ADULT MULTIVITAMIN W/MINERALS CH
1.0000 | ORAL_TABLET | Freq: Every day | ORAL | Status: DC
  Administered 2017-09-23 – 2017-09-24 (×2): 1 via ORAL
  Filled 2017-09-23 (×2): qty 1

## 2017-09-23 MED ORDER — HYDROCODONE-ACETAMINOPHEN 5-325 MG PO TABS: 1.0000 | ORAL_TABLET | ORAL | Status: DC | PRN

## 2017-09-23 MED ORDER — IPRATROPIUM-ALBUTEROL 0.5-2.5 (3) MG/3ML IN SOLN: 3.0000 mL | Freq: Four times a day (QID) | RESPIRATORY_TRACT | Status: DC | PRN

## 2017-09-23 MED ORDER — ACETAMINOPHEN 325 MG PO TABS: 650.0000 mg | ORAL_TABLET | Freq: Four times a day (QID) | ORAL | Status: DC | PRN

## 2017-09-23 MED ORDER — LORAZEPAM 2 MG/ML IJ SOLN
1.0000 mg | Freq: Once | INTRAMUSCULAR | Status: AC
  Administered 2017-09-23: 1 mg via INTRAVENOUS
  Filled 2017-09-23: qty 1

## 2017-09-23 MED ORDER — DEXMEDETOMIDINE HCL IN NACL 400 MCG/100ML IV SOLN: 0.2000 ug/kg/h | INTRAVENOUS | Status: DC

## 2017-09-23 MED ORDER — LORAZEPAM 2 MG/ML IJ SOLN
2.0000 mg | INTRAMUSCULAR | Status: DC | PRN
  Administered 2017-09-23 – 2017-09-24 (×7): 2 mg via INTRAVENOUS
  Administered 2017-09-24 (×2): 3 mg via INTRAVENOUS
  Administered 2017-09-24: 2 mg via INTRAVENOUS
  Administered 2017-09-24: 3 mg via INTRAVENOUS
  Administered 2017-09-24: 2 mg via INTRAVENOUS
  Filled 2017-09-23 (×7): qty 1
  Filled 2017-09-23: qty 2
  Filled 2017-09-23 (×3): qty 1
  Filled 2017-09-23: qty 2

## 2017-09-23 NOTE — Progress Notes (Signed)
Patient admitted from the ED during shift. Patient given prn ativan per orders and CIWA scale. Patient mostly calm, only occasionally restless with reported anxiety (see CIWA scores). Tremors present at all times. Patient only complained about tremors during shift, which ativan did improve. No nausea or vomiting. RN educated on plan for withdraw while in hospital. Patient with minimal appetite but drinking fluids well. Voided once with no assistance from staff.

## 2017-09-23 NOTE — ED Triage Notes (Signed)
States last drink this am. Has been trying to decrease. Jumpy, reaching for things in air with nothing there, startles easy, severe tremor. Denies history of seizure with withdrawal in past.

## 2017-09-23 NOTE — Care Management (Signed)
Per record patient has been here in [place] since September 2018. He is still showing with Medicaid under [place] state (not [place]).  Patient will need to contact DSS to have Medicaid coverage in [place]. RNCM team to follow.

## 2017-09-23 NOTE — Consult Note (Signed)
   CHIEF COMPLAINT:   Weakness and agitation  Subjective  57 yo presenting with 3-day history of alcohol withdrawal after recently cutting down on his alcohol consumption, patient typically will drink 1-2 cases of beer a day, has been having shakiness, jitteriness, nausea, emesis, vague abdominal discomfort, visual hallucinations over the last several days, in the emergency room patient was noted to be tachycardic, tachypneic, seeing things that were not present, talking strangely, restlessness  Admitted for ETOH ABUSE CIWA protocol started     Objective   Examination:  General exam: +agitated intermittantly  Respiratory system: Clear to auscultation. Respiratory effort normal. HEENT: [place]/AT, PERRLA, no thrush, no stridor. Cardiovascular system: S1 & S2 heard, RRR. No JVD, murmurs, rubs, gallops or clicks. No pedal edema. Gastrointestinal system: Abdomen is nondistended, soft and nontender. No organomegaly or masses felt. Normal bowel sounds heard. Central nervous system: somnelent Extremities: Symmetric 5 x 5 power. Skin: No rashes, lesions or ulcers Psychiatry: Judgement and insight appear normal. Mood & affect appropriate.   VITALS:  height is 5\' 10"  (1.778 m) and weight is 195 lb (88.5 kg). His oral temperature is 97.8 F (36.6 C). His blood pressure is 166/100 (abnormal) and his pulse is 118 (abnormal). His respiration is 27 (abnormal) and oxygen saturation is 93%.   I personally reviewed Labs under Results section.      Assessment/Plan:  57 yo white male admitted for acute ETOH  intoxication(elevated ETOH levels 220) with agitation High risk for DT's Continue CIWA protocol Remain step down IVF's as needed Thiamine and Folic acid  Code Status: FULL   Disposition Plan Step down status    Lucie LeatherKurian David Qiana Landgrebe, M.D.  [facility]  Medical Director [facility] Medical Director [facility]

## 2017-09-23 NOTE — ED Notes (Signed)
Pt appears more relaxed and reports feeling "better."

## 2017-09-23 NOTE — ED Provider Notes (Signed)
[facility] Emergency Department Provider Note   ____________________________________________   First MD Initiated Contact with Patient 09/23/17 0901     (approximate)  I have reviewed the triage vital signs and the nursing notes.   HISTORY  Chief Complaint Withdrawal   HPI Jack Lowe is a 57 y.o. male Patient reports he usually drinks 25 beers a day. This been having some nausea and vomiting for the last day or 2 and some abdominal pain and so now his beer intake today was only half a beer. He wants help gettingoff the alcohol.   Past Medical History:  Diagnosis Date  . Alcohol abuse   . COPD (chronic obstructive pulmonary disease) (HCC)   . Hypertension   . Schizophrenia [facility])     Patient Active Problem List   Diagnosis Date Noted  . Alcohol withdrawal (HCC) 05/28/2017  . COPD (chronic obstructive pulmonary disease) (HCC) 01/10/2016  . Delirium tremens (HCC) 01/09/2016    Past Surgical History:  Procedure Laterality Date  . COLONOSCOPY      Prior to Admission medications   Medication Sig Start Date End Date Taking? Authorizing Provider  metoprolol succinate (TOPROL-XL) 25 MG 24 hr tablet Take 25 mg by mouth daily.    [provider]  predniSONE (DELTASONE) 20 MG tablet Take 3 tablets (60 mg total) by mouth daily. Patient not taking: Reported on 05/28/2017 04/28/16   Loleta Rose, MD    Allergies Haldol [haloperidol] and Toradol [ketorolac tromethamine]  Family History  Problem Relation Age of Onset  . Hyperlipidemia Unknown   . Lung cancer Unknown     Social History Social History   Tobacco Use  . Smoking status: Current Every Day Smoker    Packs/day: 2.00    Types: Cigarettes  . Smokeless tobacco: Current User  Substance Use Topics  . Alcohol use: Yes    Alcohol/week: 0.0 oz  . Drug use: No    Review of Systems  Constitutional: No fever/chills Eyes: No visual changes. ENT: No sore  throat. Cardiovascular: Denies chest pain. Respiratory: Denies shortness of breath. Gastrointestinal: No abdominal pain.   nausea, vomiting.  No diarrhea.  No constipation. Genitourinary: Negative for dysuria. Musculoskeletal: Negative for back pain. Skin: Negative for rash. Neurological: Negative for headaches, focal weakness   ____________________________________________   PHYSICAL EXAM:  VITAL SIGNS: ED Triage Vitals  Enc Vitals Group     BP 09/23/17 0851 (!) 179/90     Pulse Rate 09/23/17 0851 (!) 115     Resp 09/23/17 0851 20     Temp 09/23/17 0851 98 F (36.7 C)     Temp Source 09/23/17 0851 Oral     SpO2 09/23/17 0851 94 %     Weight 09/23/17 0852 195 lb (88.5 kg)     Height 09/23/17 0852 5\' 10"  (1.778 m)     Head Circumference --      Peak Flow --      Pain Score --      Pain Loc --      Pain Edu? --      Excl. in GC? --     Constitutional: Alert initially patient was somewhat confused reaching up into the air to turn off for just treating with his hands at things that did not appear to be there but he was talking to me and making sense. He is tachycardic and shaky and flushed. Even after 3 of Ativan he still tachycardic shaky and flushed. He is not reaching  up into the air to turn any knobs anymore though.  Eyes: Conjunctivae are normal.  Head: Atraumatic. Nose: No congestion/rhinnorhea. Mouth/Throat: Mucous membranes are moist.  Oropharynx non-erythematous. Neck: No stridor. Cardiovascular:rapid rate, regular rhythm. Grossly normal heart sounds.  Good peripheral circulation. Respiratory: Normal respiratory effort.  No retractions. Lungs CTAB. Gastrointestinal: Soft and nontender. No distention. No abdominal bruits. No CVA tenderness.  Musculoskeletal: No lower extremity tenderness nor edema.  No joint effusions. Neurologic:  Normal speech and language. No gross focal neurologic deficits are appreciated. No gait instability. Skin:  Skin is warm, dry and intactand  flushed. No rash noted.    ____________________________________________   LABS (all labs ordered are listed, but only abnormal results are displayed)  Labs Reviewed  ACETAMINOPHEN LEVEL - Abnormal; Notable for the following components:      Result Value   Acetaminophen (Tylenol), Serum <10 (*)    All other components within normal limits  COMPREHENSIVE METABOLIC PANEL - Abnormal; Notable for the following components:   Chloride 99 (*)    Glucose, Bld 128 (*)    AST 110 (*)    Alkaline Phosphatase 179 (*)    Anion gap 16 (*)    All other components within normal limits  ETHANOL - Abnormal; Notable for the following components:   Alcohol, Ethyl (B) 223 (*)    All other components within normal limits  LACTIC ACID, PLASMA - Abnormal; Notable for the following components:   Lactic Acid, Venous 2.0 (*)    All other components within normal limits  CBC WITH DIFFERENTIAL/PLATELET - Abnormal; Notable for the following components:   RDW 16.4 (*)    Neutro Abs 7.0 (*)    Basophils Absolute 0.2 (*)    All other components within normal limits  LIPASE, BLOOD  TROPONIN I  SALICYLATE LEVEL  LACTIC ACID, PLASMA  URINALYSIS, COMPLETE (UACMP) WITH MICROSCOPIC  URINE DRUG SCREEN, QUALITATIVE ([facility] ONLY)   ____________________________________________  EKG EKG read and interpreted by me shows sinus tachycardia rate of 107 normal axis there is some ST flattening but no acute changes. Note on the monitors heart rate at the time of this dictation 11:17 is 122 ____________________________________________  RADIOLOGY  Dg Abdomen Acute W/chest  Result Date: 09/23/2017 CLINICAL DATA:  Vomiting for 1 week EXAM: DG ABDOMEN ACUTE W/ 1V CHEST COMPARISON:  May 28, 2017, April 27, 2016 FINDINGS: There is no evidence of dilated bowel loops or free intraperitoneal air. No radiopaque calculi or other significant radiographic abnormality is seen. Heart size and mediastinal contours are within normal  limits. Both lungs are clear. Chronic deformity of several lower left ribs are identified unchanged compared prior exam of 2017. IMPRESSION: Negative abdominal radiographs.  No acute cardiopulmonary disease. Electronically Signed   By: Sherian ReinWei-Chen  Lin M.D.   On: 09/23/2017 10:23   chest and belly x-rays are normal ____________________________________________   PROCEDURES  Procedure(s) performed:   Procedures  Critical Care performed:   ____________________________________________   INITIAL IMPRESSION / ASSESSMENT AND PLAN / ED COURSE patient with alcohol withdrawal and some initial symptoms of delirium we will admit him for medical stabilization      ____________________________________________   FINAL CLINICAL IMPRESSION(S) / ED DIAGNOSES  Final diagnoses:  Alcohol withdrawal syndrome without complication [facility](HCC)     ED Discharge Orders    None       Note:  This document was prepared using Dragon voice recognition software and may include unintentional dictation errors.    Arnaldo NatalMalinda, Dammon Makarewicz F, MD 09/23/17  1119  

## 2017-09-23 NOTE — H&P (Signed)
[facility] - [place] at [facility]   PATIENT NAME: Amer Alcindor    MR#:  [id number]  DATE OF BIRTH:  17-Mar-1961  DATE OF ADMISSION:  09/23/2017  PRIMARY CARE PHYSICIAN: System, Pcp Not In   REQUESTING/REFERRING PHYSICIAN:   CHIEF COMPLAINT:   Chief Complaint  Patient presents with  . Withdrawal    HISTORY OF PRESENT ILLNESS: Mart Colpitts  is a 57 y.o. male with a known history per below, including alcoholism with history of DUIs, patient/patient's significant other deny history of schizophrenia, presenting with 3-day history of alcohol withdrawal after recently cutting down on his alcohol consumption, patient typically will drink 1-2 cases of beer a day, has been having shakiness, jitteriness, nausea, emesis, vague abdominal discomfort, visual hallucinations over the last several days, in the emergency room patient was noted to be tachycardic, tachypneic, seeing things that were not present, talking strangely, restlessness, ER workup noted for anion gap 16, AST 110, lactic acid 2, alcohol level 220, chest x-ray negative, patient evaluated in the emergency room with significant other as well as child at the bedside, patient now been admitted for acute delirium tremens secondary to chronic severe alcoholism.  PAST MEDICAL HISTORY:   Past Medical History:  Diagnosis Date  . Alcohol abuse   . COPD (chronic obstructive pulmonary disease) (HCC)   . Hypertension   . Schizophrenia (HCC)     PAST SURGICAL HISTORY:  Past Surgical History:  Procedure Laterality Date  . COLONOSCOPY      SOCIAL HISTORY:  Social History   Tobacco Use  . Smoking status: Current Every Day Smoker    Packs/day: 2.00    Types: Cigarettes  . Smokeless tobacco: Current User  Substance Use Topics  . Alcohol use: Yes    Alcohol/week: 0.0 oz    FAMILY HISTORY:  Family History  Problem Relation Age of Onset  . Hyperlipidemia Unknown   . Lung cancer Unknown     DRUG ALLERGIES:   Allergies  Allergen Reactions  . Haldol [Haloperidol] Swelling    Tongue swelling  . Toradol [Ketorolac Tromethamine] Swelling    REVIEW OF SYSTEMS:   CONSTITUTIONAL: No fever, +fatigue or weakness.  EYES: No blurred or double vision.  EARS, NOSE, AND THROAT: No tinnitus or ear pain.  RESPIRATORY: No cough, shortness of breath, wheezing or hemoptysis.  CARDIOVASCULAR: No chest pain, orthopnea, edema.  GASTROINTESTINAL: + nausea, vomiting, vague abdominal discomfort  GENITOURINARY: No dysuria, hematuria.  ENDOCRINE: No polyuria, nocturia,  HEMATOLOGY: No anemia, easy bruising or bleeding SKIN: No rash or lesion. MUSCULOSKELETAL: No joint pain or arthritis.   NEUROLOGIC: No tingling, numbness, weakness.  Jitteriness, seeing things that are not present, talking strangely PSYCHIATRY: No anxiety or depression.   MEDICATIONS AT HOME:  Prior to Admission medications   Medication Sig Start Date End Date Taking? Authorizing Provider  metoprolol succinate (TOPROL-XL) 25 MG 24 hr tablet Take 25 mg by mouth daily.   Yes [provider]  predniSONE (DELTASONE) 20 MG tablet Take 3 tablets (60 mg total) by mouth daily. Patient not taking: Reported on 05/28/2017 04/28/16   Loleta Rose, MD      PHYSICAL EXAMINATION:   VITAL SIGNS: Blood pressure 140/80, pulse (!) 116, temperature 98 F (36.7 C), temperature source Oral, resp. rate (!) 24, height 5\' 10"  (1.778 m), weight 88.5 kg (195 lb), SpO2 94 %.  GENERAL:  57 y.o.-year-old patient lying in the bed with no acute distress.  Obese EYES: Pupils equal, round, reactive to light  and accommodation. No scleral icterus. Extraocular muscles intact.  HEENT: Head atraumatic, normocephalic. Oropharynx and nasopharynx clear.  NECK:  Supple, no jugular venous distention. No thyroid enlargement, no tenderness.  LUNGS: Faint wheezing diffusely. No use of accessory muscles of respiration.  CARDIOVASCULAR: S1, S2 normal. No murmurs, rubs, or  gallops.  ABDOMEN: Soft, nontender, nondistended. Bowel sounds present. No organomegaly or mass.  EXTREMITIES: No pedal edema, cyanosis, or clubbing.  NEUROLOGIC: Cranial nerves II through XII are intact. MAES. Gait not checked.  Severe restlessness, slow mentation pSYCHIATRIC: The patient is alert and oriented x 3.  Visual hallucinations, agitation SKIN: No obvious rash, lesion, or ulcer.   LABORATORY PANEL:   CBC Recent Labs  Lab 09/23/17 0904  WBC 9.7  HGB 15.3  HCT 44.7  PLT 247  MCV 94.8  MCH 32.5  MCHC 34.3  RDW 16.4*  LYMPHSABS 1.8  MONOABS 0.7  EOSABS 0.1  BASOSABS 0.2*   ------------------------------------------------------------------------------------------------------------------  Chemistries  Recent Labs  Lab 09/23/17 0904  NA 138  K 4.3  CL 99*  CO2 23  GLUCOSE 128*  BUN 6  CREATININE 0.68  CALCIUM 8.9  AST 110*  ALT 58  ALKPHOS 179*  BILITOT 1.0   ------------------------------------------------------------------------------------------------------------------ estimated creatinine clearance is 118.3 mL/min (by C-G formula based on SCr of 0.68 mg/dL). ------------------------------------------------------------------------------------------------------------------ No results for input(s): TSH, T4TOTAL, T3FREE, THYROIDAB in the last 72 hours.  Invalid input(s): FREET3   Coagulation profile No results for input(s): INR, PROTIME in the last 168 hours. ------------------------------------------------------------------------------------------------------------------- No results for input(s): DDIMER in the last 72 hours. -------------------------------------------------------------------------------------------------------------------  Cardiac Enzymes Recent Labs  Lab 09/23/17 0904  TROPONINI <0.03   ------------------------------------------------------------------------------------------------------------------ Invalid input(s):  POCBNP  ---------------------------------------------------------------------------------------------------------------  Urinalysis    Component Value Date/Time   COLORURINE Amber 12/06/2014 1832   APPEARANCEUR Clear 12/06/2014 1832   LABSPEC 1.025 12/06/2014 1832   PHURINE 6.0 12/06/2014 1832   GLUCOSEU Negative 12/06/2014 1832   HGBUR Negative 12/06/2014 1832   BILIRUBINUR Negative 12/06/2014 1832   KETONESUR Negative 12/06/2014 1832   PROTEINUR 30 mg/dL 40/98/119104/12/2014 47821832   NITRITE Negative 12/06/2014 1832   LEUKOCYTESUR Negative 12/06/2014 1832     RADIOLOGY: Dg Abdomen Acute W/chest  Result Date: 09/23/2017 CLINICAL DATA:  Vomiting for 1 week EXAM: DG ABDOMEN ACUTE W/ 1V CHEST COMPARISON:  May 28, 2017, April 27, 2016 FINDINGS: There is no evidence of dilated bowel loops or free intraperitoneal air. No radiopaque calculi or other significant radiographic abnormality is seen. Heart size and mediastinal contours are within normal limits. Both lungs are clear. Chronic deformity of several lower left ribs are identified unchanged compared prior exam of 2017. IMPRESSION: Negative abdominal radiographs.  No acute cardiopulmonary disease. Electronically Signed   By: Sherian ReinWei-Chen  Lin M.D.   On: 09/23/2017 10:23    EKG: Orders placed or performed during the hospital encounter of 09/23/17  . ED EKG  . ED EKG  . EKG 12-Lead  . EKG 12-Lead    IMPRESSION AND PLAN: 1 Acute delirium tremens Secondary to severe alcoholism Admit to stepdown unit on our alcohol withdrawal protocol, neurochecks per routine, aspiration/fall/seizure precautions, and continue close medical monitoring  2 acute on chronic alcoholism With history of DUIs Plan of care as stated above  3 acute lactic acidosis Secondary to above IV fluids for rehydration  4 COPD without exacerbation Breathing treatments as needed  5 chronic tobacco smoking abuse/dependency Nicotine patch and cessation counseling  ordered  6 acute nausea/vomiting/vague abdominal discomfort Most likely secondary to above  Antiemetics PRN, IV fluids for rehydration, and continue serial abdominal examinations, PPI daily    All the records are reviewed and case discussed with ED provider. Management plans discussed with the patient, family and they are in agreement.  CODE STATUS:    Code Status Orders  (From admission, onward)        Start     Ordered   09/23/17 1037  Full code  Continuous     09/23/17 1037    Code Status History    Date Active Date Inactive Code Status Order ID Comments User Context   09/23/2017 10:36 09/23/2017 10:37 Full Code [id number]  Arnaldo Natal, MD ED   05/28/2017 12:52 05/29/2017 18:44 Full Code [id number]  Gracelyn Nurse, MD Inpatient   01/10/2016 01:53 01/10/2016 20:40 Full Code [id number]  Oralia Manis, MD Inpatient       TOTAL TIME TAKING CARE OF THIS PATIENT: 40 minutes of critical care time was used to discuss plan of care with ER attending, critical care attending, charge nurse, patient/patient's family which includes significant other/child with all questions answered.    Evelena Asa Salary M.D on 09/23/2017   Between 7am to 6pm - Pager - [phone]  After 6pm go to [email or web address] - password [email or web address]  [facility] [place] [facility]  Office  [phone]  CC: Primary care physician; System, Pcp Not In   Note: This dictation was prepared with Dragon dictation along with smaller phrase technology. Any transcriptional errors that result from this process are unintentional.

## 2017-09-23 NOTE — Consult Note (Signed)
PHARMACY CONSULT NOTE - INITIAL   Pharmacy Consult for Electrolyte Monitoring and Replacement   Patient Measurements: Height: 5\' 10"  (177.8 cm) Weight: 195 lb (88.5 kg) IBW/kg (Calculated) : 73  Vital Signs: Temp: 97.8 F (36.6 C) (01/21 1230) Temp Source: Oral (01/21 1230) BP: 166/100 (01/21 1300) Pulse Rate: 118 (01/21 1310) Intake/Output from previous day: No intake/output data recorded. Intake/Output from this shift: No intake/output data recorded.  Labs: Recent Labs    09/23/17 0904  WBC 9.7  HGB 15.3  HCT 44.7  PLT 247  CREATININE 0.68  MG 1.7  ALBUMIN 3.9  PROT 7.9  AST 110*  ALT 58  ALKPHOS 179*  BILITOT 1.0   Potassium (mmol/L)  Date Value  09/23/2017 4.3  12/07/2014 3.3 (L)   Sodium (mmol/L)  Date Value  09/23/2017 138  12/07/2014 138   Magnesium (mg/dL)  Date Value  60/45/409801/21/2019 1.7  11/25/2014 2.1   Calcium (mg/dL)  Date Value  11/91/478201/21/2019 8.9   Calcium, Total (mg/dL)  Date Value  95/62/130804/01/2015 7.9 (L)   Albumin (g/dL)  Date Value  65/78/469601/21/2019 3.9  12/06/2014 3.1 (L)  ] Estimated Creatinine Clearance: 118.3 mL/min (by C-G formula based on SCr of 0.68 mg/dL).  Medical History: Past Medical History:  Diagnosis Date  . Alcohol abuse   . COPD (chronic obstructive pulmonary disease) (HCC)   . Hypertension   . Schizophrenia [facility](HCC)     Assessment: Pharmacy consulted for electrolyte monitoring and replacement in 57 yo male admitted with DTs and alcohol withdrawal.   Goal of Therapy:  Electrolytes WNL K+: >4 mmol/L Mg: >2 mg/dL  Plan:  K: 4.3, Mg: 1.7. Will order Magnesium 2g IV x 1 dose.  Will F/U with AM labs and continue to replace electrolytes as needed.   Gardner CandleSheema M Lyndsee Casa, PharmD, BCPS Clinical Pharmacist 09/23/2017 1:44 PM

## 2017-09-23 NOTE — ED Notes (Signed)
Lactic acid of 2.0 [facility]

## 2017-09-23 NOTE — Progress Notes (Signed)
CRITICAL VALUE ALERT  Critical Value:  Lactic Acid 2.5 (previous 2.0)  Date & Time Notied: 09/23/2017 13:50  Provider Notified: Dr. Belia HemanKasa  Orders Received/Actions taken: No new orders at this time. MD acknowledged critical and reviewed patient case with RN.

## 2017-09-24 LAB — BASIC METABOLIC PANEL
ANION GAP: 11 (ref 5–15)
BUN: 7 mg/dL (ref 6–20)
CO2: 27 mmol/L (ref 22–32)
Calcium: 8.5 mg/dL — ABNORMAL LOW (ref 8.9–10.3)
Chloride: 97 mmol/L — ABNORMAL LOW (ref 101–111)
Creatinine, Ser: 0.5 mg/dL — ABNORMAL LOW (ref 0.61–1.24)
Glucose, Bld: 110 mg/dL — ABNORMAL HIGH (ref 65–99)
POTASSIUM: 3.5 mmol/L (ref 3.5–5.1)
SODIUM: 135 mmol/L (ref 135–145)

## 2017-09-24 LAB — CBC
HEMATOCRIT: 41.4 % (ref 40.0–52.0)
HEMOGLOBIN: 14.1 g/dL (ref 13.0–18.0)
MCH: 31.9 pg (ref 26.0–34.0)
MCHC: 34 g/dL (ref 32.0–36.0)
MCV: 93.9 fL (ref 80.0–100.0)
Platelets: 170 10*3/uL (ref 150–440)
RBC: 4.41 MIL/uL (ref 4.40–5.90)
RDW: 16.5 % — AB (ref 11.5–14.5)
WBC: 8.8 10*3/uL (ref 3.8–10.6)

## 2017-09-24 LAB — LACTIC ACID, PLASMA: Lactic Acid, Venous: 1.1 mmol/L (ref 0.5–1.9)

## 2017-09-24 LAB — PHOSPHORUS: PHOSPHORUS: 2.4 mg/dL — AB (ref 2.5–4.6)

## 2017-09-24 LAB — MAGNESIUM: Magnesium: 1.8 mg/dL (ref 1.7–2.4)

## 2017-09-24 MED ORDER — MAGNESIUM SULFATE 2 GM/50ML IV SOLN
2.0000 g | Freq: Once | INTRAVENOUS | Status: AC
  Administered 2017-09-24: 2 g via INTRAVENOUS
  Filled 2017-09-24: qty 50

## 2017-09-24 MED ORDER — POTASSIUM CHLORIDE 20 MEQ PO PACK
40.0000 meq | PACK | Freq: Once | ORAL | Status: AC
  Administered 2017-09-24: 40 meq via ORAL
  Filled 2017-09-24: qty 2

## 2017-09-24 MED ORDER — K PHOS MONO-SOD PHOS DI & MONO 155-852-130 MG PO TABS
500.0000 mg | ORAL_TABLET | ORAL | Status: AC
  Administered 2017-09-24 (×2): 500 mg via ORAL
  Filled 2017-09-24 (×2): qty 2

## 2017-09-24 MED ORDER — QUETIAPINE FUMARATE 25 MG PO TABS
25.0000 mg | ORAL_TABLET | Freq: Two times a day (BID) | ORAL | Status: DC
  Administered 2017-09-24: 25 mg via ORAL
  Filled 2017-09-24: qty 1

## 2017-09-24 NOTE — Consult Note (Signed)
PHARMACY CONSULT NOTE - INITIAL   Pharmacy Consult for Electrolyte Monitoring and Replacement   Patient Measurements: Height: 5\' 10"  (177.8 cm) Weight: 195 lb (88.5 kg) IBW/kg (Calculated) : 73  Vital Signs: Temp: 99.1 F (37.3 C) (01/22 0800) Temp Source: Oral (01/22 0800) BP: 155/70 (01/22 0800) Pulse Rate: 96 (01/22 0800) Intake/Output from previous day: 01/21 0701 - 01/22 0700 In: 751.3 [P.O.:720; I.V.:31.3] Out: 955 [Urine:955] Intake/Output from this shift: No intake/output data recorded.  Labs: Recent Labs    09/23/17 0904 09/24/17 0504  WBC 9.7 8.8  HGB 15.3 14.1  HCT 44.7 41.4  PLT 247 170  CREATININE 0.68 0.50*  MG 1.7 1.8  PHOS  --  2.4*  ALBUMIN 3.9  --   PROT 7.9  --   AST 110*  --   ALT 58  --   ALKPHOS 179*  --   BILITOT 1.0  --    Potassium (mmol/L)  Date Value  09/24/2017 3.5  12/07/2014 3.3 (L)   Sodium (mmol/L)  Date Value  09/24/2017 135  12/07/2014 138   Magnesium (mg/dL)  Date Value  96/04/540901/22/2019 1.8  11/25/2014 2.1   Calcium (mg/dL)  Date Value  81/19/147801/22/2019 8.5 (L)   Calcium, Total (mg/dL)  Date Value  29/56/213004/01/2015 7.9 (L)   Albumin (g/dL)  Date Value  86/57/846901/21/2019 3.9  12/06/2014 3.1 (L)  ] Phosphorus (mg/dL)  Date Value  62/95/284101/22/2019 2.4 (L)  11/25/2014 3.2  ]  Estimated Creatinine Clearance: 118.3 mL/min (A) (by C-G formula based on SCr of 0.5 mg/dL (L)).  Medical History: Past Medical History:  Diagnosis Date  . Alcohol abuse   . COPD (chronic obstructive pulmonary disease) (HCC)   . Hypertension   . Schizophrenia [facility](HCC)     Assessment: Pharmacy consulted for electrolyte monitoring and replacement in 57 yo male admitted with DTs and alcohol withdrawal.   Goal of Therapy:  Electrolytes WNL K+: >4 mmol/L Mg: >2 mg/dL  Plan:  K: 3.5, Mg: 1.8 Phos 2.4 Will order Magnesium 2g IV x 1 dose.  KCL 40mEq x 1 dose already ordered and given.  Will order KPhos Neutral tab 500mg  x 2 doses.  Will F/U with AM labs and  continue to replace electrolytes as needed.   Gardner CandleSheema M Cherolyn Behrle, PharmD, BCPS Clinical Pharmacist 09/24/2017 9:50 AM

## 2017-09-24 NOTE — Progress Notes (Signed)
For the past hour the patient has been stating that he wanted to go home. I spoke with him and relayed all the information related to alcohol withdrawal and how it would be safer to go through detox here at the hospital. He was still wanting to go home so I called my charge nurse and asked her to speak with him. The charge nurse did speak with him and it seemed she had made progress however, an hour later the patient took off all of his leads and monitor equipment and got fully dressed in underwear, jeans, socks, shoes, and shift. The patient now states he's ready to go home. Night time provider NP was notified and she spoke with the patient at bedside and fully made him aware of his condition and the ramifications of leaving the hospital AMA. The patient still chose to leave therefore we assisted him with contacting his ride home. We removed his IV's and had him sign the Leaving AMA form. Pt was fully alert and oriented throughout the conversation and his leaving the unit.

## 2017-09-24 NOTE — Progress Notes (Signed)
   [facility] [place] [facility]   PATIENT NAME: Jack MiuFreddie Lowe    MR#:  [id number]  DATE OF BIRTH:  [birth year]  SUBJECTIVE:  CHIEF COMPLAINT:   Chief Complaint  Patient presents with  . Withdrawal  shaky, wants to leave REVIEW OF SYSTEMS:  Review of Systems  Constitutional: Negative for chills, fever and weight loss.  HENT: Negative for nosebleeds and sore throat.   Eyes: Negative for blurred vision.  Respiratory: Negative for cough, shortness of breath and wheezing.   Cardiovascular: Negative for chest pain, orthopnea, leg swelling and PND.  Gastrointestinal: Negative for abdominal pain, constipation, diarrhea, heartburn, nausea and vomiting.  Genitourinary: Negative for dysuria and urgency.  Musculoskeletal: Negative for back pain.  Skin: Negative for rash.  Neurological: Negative for dizziness, speech change, focal weakness and headaches.  Endo/Heme/Allergies: Does not bruise/bleed easily.  Psychiatric/Behavioral: Negative for depression.    DRUG ALLERGIES:   Allergies  Allergen Reactions  . Haldol [Haloperidol] Swelling    Tongue swelling  . Toradol [Ketorolac Tromethamine] Swelling   VITALS:  Blood pressure (!) 150/84, pulse 84, temperature 98.9 F (37.2 C), temperature source Oral, resp. rate (!) 27, height 5\' 10"  (1.778 m), weight 88.5 kg (195 lb), SpO2 93 %. PHYSICAL EXAMINATION:  Physical Exam  Neurological: He is agitated.  Psychiatric: His mood appears anxious. His affect is inappropriate. He expresses impulsivity.   LABORATORY PANEL:  Male CBC Recent Labs  Lab 09/24/17 0504  WBC 8.8  HGB 14.1  HCT 41.4  PLT 170   ------------------------------------------------------------------------------------------------------------------ Chemistries  Recent Labs  Lab 09/23/17 0904 09/24/17 0504  NA 138 135  K 4.3 3.5  CL 99* 97*  CO2 23 27  GLUCOSE 128* 110*  BUN 6 7  CREATININE 0.68 0.50*  CALCIUM 8.9 8.5*  MG 1.7 1.8    AST 110*  --   ALT 58  --   ALKPHOS 179*  --   BILITOT 1.0  --    RADIOLOGY:  No results found. ASSESSMENT AND PLAN:  1 Acute delirium tremens Secondary to severe alcoholism - continue alcohol withdrawal protocol, neurochecks per routine, aspiration/fall/seizure precautions, and continue close medical monitoring  2 acute on chronic alcoholism With history of DUIs  3 acute lactic acidosis Secondary to above - continue IV fluids for rehydration  4 COPD without exacerbation Breathing treatments as needed  5 chronic tobacco smoking abuse/dependency Nicotine patch and cessation counseling ordered  6 acute nausea/vomiting/vague abdominal discomfort Most likely secondary to above Antiemetics PRN, IV fluids for rehydration, and continue serial abdominal examinations, PPI daily       All the records are reviewed and case discussed with Care Management/Social Worker. Management plans discussed with the patient, nursing and they are in agreement.  CODE STATUS: Full Code  TOTAL TIME TAKING CARE OF THIS PATIENT: 35 minutes.   More than 50% of the time was spent in counseling/coordination of care: YES  POSSIBLE D/C IN 1-2 DAYS, DEPENDING ON CLINICAL CONDITION.   Delfino LovettVipul Ayvion Kavanagh M.D on 09/24/2017 at 4:36 PM  Between 7am to 6pm - Pager - [phone]  After 6pm go to [email or web address] - Social research officer, governmentpassword EPAS [facility]  [facility] [place] [facility]  Office  [phone]  CC: Primary care physician; System, Pcp Not In  Note: This dictation was prepared with Dragon dictation along with smaller phrase technology. Any transcriptional errors that result from this process are unintentional.

## 2017-09-24 NOTE — Progress Notes (Signed)
Pt is alert/oriented, follows commands, and no indications of intoxication stating he wants to go home AMA.  I explained to him ETOH withdrawal can be life threatening if he is not monitored during detox.  He states he understands this, however he is still unwilling to stay in the hospital and he states he is completely aware of the situation and risk.  Therefore, pt left AMA after I explained to him he may not survive due to ETOH withdrawal symptoms and dangerous lifestyle choices.  He did call someone to pick him up.  Sonda Rumbleana Cobain Morici, AGNP  Pulmonary/Critical Care Pager [phone] (please enter 7 digits) PCCM Consult Pager [phone] (please enter 7 digits)

## 2017-10-07 NOTE — Discharge Summary (Signed)
Pt is alert/oriented, follows commands, and no indications of intoxication stating he wants to go home AMA.  I explained to him ETOH withdrawal can be life threatening if he is not monitored during detox.  He states he understands this, however he is still unwilling to stay in the hospital and he states he is completely aware of the situation and risk.  Therefore, pt left AMA after I explained to him he may not survive due to ETOH withdrawal symptoms and dangerous lifestyle choices.  He did call someone to pick him up.  Patient Signed out AMA.

## 2017-12-24 ENCOUNTER — Inpatient Hospital Stay: Payer: Self-pay

## 2017-12-24 ENCOUNTER — Encounter: Payer: Self-pay | Admitting: Emergency Medicine

## 2017-12-24 ENCOUNTER — Inpatient Hospital Stay
Admission: EM | Admit: 2017-12-24 | Discharge: 2018-01-01 | DRG: 871 | Disposition: E | Payer: Medicaid - Out of State | Attending: Internal Medicine | Admitting: Internal Medicine

## 2017-12-24 ENCOUNTER — Inpatient Hospital Stay: Payer: Medicaid - Out of State

## 2017-12-24 ENCOUNTER — Other Ambulatory Visit: Payer: Self-pay

## 2017-12-24 DIAGNOSIS — J151 Pneumonia due to Pseudomonas: Secondary | ICD-10-CM | POA: Diagnosis present

## 2017-12-24 DIAGNOSIS — E861 Hypovolemia: Secondary | ICD-10-CM | POA: Diagnosis present

## 2017-12-24 DIAGNOSIS — E7220 Disorder of urea cycle metabolism, unspecified: Secondary | ICD-10-CM

## 2017-12-24 DIAGNOSIS — E871 Hypo-osmolality and hyponatremia: Secondary | ICD-10-CM | POA: Diagnosis not present

## 2017-12-24 DIAGNOSIS — Z7951 Long term (current) use of inhaled steroids: Secondary | ICD-10-CM

## 2017-12-24 DIAGNOSIS — K767 Hepatorenal syndrome: Secondary | ICD-10-CM | POA: Diagnosis not present

## 2017-12-24 DIAGNOSIS — J9600 Acute respiratory failure, unspecified whether with hypoxia or hypercapnia: Secondary | ICD-10-CM | POA: Diagnosis not present

## 2017-12-24 DIAGNOSIS — K559 Vascular disorder of intestine, unspecified: Secondary | ICD-10-CM | POA: Diagnosis present

## 2017-12-24 DIAGNOSIS — R6521 Severe sepsis with septic shock: Secondary | ICD-10-CM | POA: Diagnosis present

## 2017-12-24 DIAGNOSIS — E8729 Other acidosis: Secondary | ICD-10-CM

## 2017-12-24 DIAGNOSIS — K7200 Acute and subacute hepatic failure without coma: Secondary | ICD-10-CM

## 2017-12-24 DIAGNOSIS — Z791 Long term (current) use of non-steroidal anti-inflammatories (NSAID): Secondary | ICD-10-CM | POA: Diagnosis not present

## 2017-12-24 DIAGNOSIS — G9341 Metabolic encephalopathy: Secondary | ICD-10-CM | POA: Diagnosis present

## 2017-12-24 DIAGNOSIS — N179 Acute kidney failure, unspecified: Secondary | ICD-10-CM | POA: Diagnosis present

## 2017-12-24 DIAGNOSIS — I959 Hypotension, unspecified: Secondary | ICD-10-CM

## 2017-12-24 DIAGNOSIS — K7290 Hepatic failure, unspecified without coma: Secondary | ICD-10-CM | POA: Diagnosis present

## 2017-12-24 DIAGNOSIS — R092 Respiratory arrest: Secondary | ICD-10-CM | POA: Diagnosis not present

## 2017-12-24 DIAGNOSIS — Z452 Encounter for adjustment and management of vascular access device: Secondary | ICD-10-CM

## 2017-12-24 DIAGNOSIS — F1010 Alcohol abuse, uncomplicated: Secondary | ICD-10-CM | POA: Diagnosis not present

## 2017-12-24 DIAGNOSIS — R100 Acute abdomen: Secondary | ICD-10-CM | POA: Diagnosis present

## 2017-12-24 DIAGNOSIS — F209 Schizophrenia, unspecified: Secondary | ICD-10-CM | POA: Diagnosis present

## 2017-12-24 DIAGNOSIS — D6959 Other secondary thrombocytopenia: Secondary | ICD-10-CM | POA: Diagnosis present

## 2017-12-24 DIAGNOSIS — A419 Sepsis, unspecified organism: Secondary | ICD-10-CM | POA: Diagnosis present

## 2017-12-24 DIAGNOSIS — Z79899 Other long term (current) drug therapy: Secondary | ICD-10-CM

## 2017-12-24 DIAGNOSIS — Z978 Presence of other specified devices: Secondary | ICD-10-CM

## 2017-12-24 DIAGNOSIS — F10239 Alcohol dependence with withdrawal, unspecified: Secondary | ICD-10-CM | POA: Diagnosis present

## 2017-12-24 DIAGNOSIS — R34 Anuria and oliguria: Secondary | ICD-10-CM

## 2017-12-24 DIAGNOSIS — Z66 Do not resuscitate: Secondary | ICD-10-CM | POA: Diagnosis not present

## 2017-12-24 DIAGNOSIS — D539 Nutritional anemia, unspecified: Secondary | ICD-10-CM | POA: Diagnosis present

## 2017-12-24 DIAGNOSIS — G9340 Encephalopathy, unspecified: Secondary | ICD-10-CM | POA: Diagnosis not present

## 2017-12-24 DIAGNOSIS — Z886 Allergy status to analgesic agent status: Secondary | ICD-10-CM

## 2017-12-24 DIAGNOSIS — D72829 Elevated white blood cell count, unspecified: Secondary | ICD-10-CM

## 2017-12-24 DIAGNOSIS — J440 Chronic obstructive pulmonary disease with acute lower respiratory infection: Secondary | ICD-10-CM | POA: Diagnosis present

## 2017-12-24 DIAGNOSIS — K567 Ileus, unspecified: Secondary | ICD-10-CM

## 2017-12-24 DIAGNOSIS — J9601 Acute respiratory failure with hypoxia: Secondary | ICD-10-CM | POA: Diagnosis not present

## 2017-12-24 DIAGNOSIS — Z888 Allergy status to other drugs, medicaments and biological substances status: Secondary | ICD-10-CM

## 2017-12-24 DIAGNOSIS — D649 Anemia, unspecified: Secondary | ICD-10-CM

## 2017-12-24 DIAGNOSIS — I9581 Postprocedural hypotension: Secondary | ICD-10-CM | POA: Diagnosis not present

## 2017-12-24 DIAGNOSIS — Z515 Encounter for palliative care: Secondary | ICD-10-CM | POA: Diagnosis present

## 2017-12-24 DIAGNOSIS — K7201 Acute and subacute hepatic failure with coma: Secondary | ICD-10-CM

## 2017-12-24 DIAGNOSIS — E876 Hypokalemia: Secondary | ICD-10-CM | POA: Diagnosis present

## 2017-12-24 DIAGNOSIS — R945 Abnormal results of liver function studies: Secondary | ICD-10-CM

## 2017-12-24 DIAGNOSIS — F17210 Nicotine dependence, cigarettes, uncomplicated: Secondary | ICD-10-CM | POA: Diagnosis present

## 2017-12-24 DIAGNOSIS — R188 Other ascites: Secondary | ICD-10-CM

## 2017-12-24 DIAGNOSIS — R778 Other specified abnormalities of plasma proteins: Secondary | ICD-10-CM

## 2017-12-24 DIAGNOSIS — E860 Dehydration: Secondary | ICD-10-CM | POA: Diagnosis present

## 2017-12-24 DIAGNOSIS — R4182 Altered mental status, unspecified: Secondary | ICD-10-CM | POA: Diagnosis present

## 2017-12-24 DIAGNOSIS — Z9911 Dependence on respirator [ventilator] status: Secondary | ICD-10-CM | POA: Diagnosis not present

## 2017-12-24 DIAGNOSIS — R579 Shock, unspecified: Secondary | ICD-10-CM | POA: Diagnosis not present

## 2017-12-24 DIAGNOSIS — K7682 Hepatic encephalopathy: Secondary | ICD-10-CM

## 2017-12-24 DIAGNOSIS — K7010 Alcoholic hepatitis without ascites: Secondary | ICD-10-CM | POA: Diagnosis present

## 2017-12-24 DIAGNOSIS — I10 Essential (primary) hypertension: Secondary | ICD-10-CM | POA: Diagnosis present

## 2017-12-24 DIAGNOSIS — J9690 Respiratory failure, unspecified, unspecified whether with hypoxia or hypercapnia: Secondary | ICD-10-CM

## 2017-12-24 DIAGNOSIS — E872 Acidosis: Secondary | ICD-10-CM | POA: Diagnosis present

## 2017-12-24 DIAGNOSIS — K7460 Unspecified cirrhosis of liver: Secondary | ICD-10-CM | POA: Diagnosis present

## 2017-12-24 DIAGNOSIS — E8770 Fluid overload, unspecified: Secondary | ICD-10-CM | POA: Diagnosis present

## 2017-12-24 DIAGNOSIS — D638 Anemia in other chronic diseases classified elsewhere: Secondary | ICD-10-CM | POA: Diagnosis present

## 2017-12-24 DIAGNOSIS — K7031 Alcoholic cirrhosis of liver with ascites: Secondary | ICD-10-CM | POA: Diagnosis not present

## 2017-12-24 DIAGNOSIS — R140 Abdominal distension (gaseous): Secondary | ICD-10-CM

## 2017-12-24 DIAGNOSIS — R7989 Other specified abnormal findings of blood chemistry: Secondary | ICD-10-CM

## 2017-12-24 LAB — CBC WITH DIFFERENTIAL/PLATELET
BAND NEUTROPHILS: 7 %
BASOS PCT: 0 %
Basophils Absolute: 0 10*3/uL (ref 0–0.1)
Blasts: 0 %
EOS PCT: 0 %
Eosinophils Absolute: 0 10*3/uL (ref 0–0.7)
HCT: 26.4 % — ABNORMAL LOW (ref 40.0–52.0)
Hemoglobin: 9 g/dL — ABNORMAL LOW (ref 13.0–18.0)
LYMPHS ABS: 1.3 10*3/uL (ref 1.0–3.6)
LYMPHS PCT: 6 %
MCH: 37.5 pg — AB (ref 26.0–34.0)
MCHC: 34 g/dL (ref 32.0–36.0)
MCV: 110.6 fL — ABNORMAL HIGH (ref 80.0–100.0)
MONO ABS: 0.7 10*3/uL (ref 0.2–1.0)
MYELOCYTES: 0 %
Metamyelocytes Relative: 0 %
Monocytes Relative: 3 %
NEUTROS PCT: 84 %
Neutro Abs: 20.1 10*3/uL — ABNORMAL HIGH (ref 1.4–6.5)
OTHER: 0 %
Platelets: 171 10*3/uL (ref 150–440)
Promyelocytes Relative: 0 %
RBC: 2.39 MIL/uL — ABNORMAL LOW (ref 4.40–5.90)
RDW: 15.7 % — ABNORMAL HIGH (ref 11.5–14.5)
WBC: 22.1 10*3/uL — ABNORMAL HIGH (ref 3.8–10.6)
nRBC: 1 /100 WBC — ABNORMAL HIGH

## 2017-12-24 LAB — BLOOD GAS, ARTERIAL
Acid-base deficit: 8.9 mmol/L — ABNORMAL HIGH (ref 0.0–2.0)
BICARBONATE: 19 mmol/L — AB (ref 20.0–28.0)
FIO2: 0.4
MECHVT: 500 mL
O2 Saturation: 88.6 %
PATIENT TEMPERATURE: 37
PEEP: 5 cmH2O
PH ART: 7.18 — AB (ref 7.350–7.450)
PO2 ART: 70 mmHg — AB (ref 83.0–108.0)
RATE: 16 resp/min
pCO2 arterial: 51 mmHg — ABNORMAL HIGH (ref 32.0–48.0)

## 2017-12-24 LAB — BASIC METABOLIC PANEL
Anion gap: 13 (ref 5–15)
BUN: 18 mg/dL (ref 6–20)
CO2: 17 mmol/L — ABNORMAL LOW (ref 22–32)
Calcium: 7 mg/dL — ABNORMAL LOW (ref 8.9–10.3)
Chloride: 79 mmol/L — ABNORMAL LOW (ref 101–111)
Creatinine, Ser: UNDETERMINED mg/dL (ref 0.61–1.24)
Glucose, Bld: 83 mg/dL (ref 65–99)
Potassium: 3.6 mmol/L (ref 3.5–5.1)
Sodium: 109 mmol/L — CL (ref 135–145)

## 2017-12-24 LAB — BASIC METABOLIC PANEL WITH GFR
Anion gap: 13 (ref 5–15)
BUN: 20 mg/dL (ref 6–20)
CO2: 18 mmol/L — ABNORMAL LOW (ref 22–32)
Calcium: 7.1 mg/dL — ABNORMAL LOW (ref 8.9–10.3)
Chloride: 81 mmol/L — ABNORMAL LOW (ref 101–111)
Creatinine, Ser: UNDETERMINED mg/dL (ref 0.61–1.24)
Glucose, Bld: 99 mg/dL (ref 65–99)
Potassium: 3.3 mmol/L — ABNORMAL LOW (ref 3.5–5.1)
Sodium: 112 mmol/L — CL (ref 135–145)

## 2017-12-24 LAB — LACTIC ACID, PLASMA
Lactic Acid, Venous: 2.7 mmol/L (ref 0.5–1.9)
Lactic Acid, Venous: 1.3 mmol/L (ref 0.5–1.9)

## 2017-12-24 LAB — COMPREHENSIVE METABOLIC PANEL
ALBUMIN: 1.9 g/dL — AB (ref 3.5–5.0)
ALK PHOS: 810 U/L — AB (ref 38–126)
ALT: 78 U/L — ABNORMAL HIGH (ref 17–63)
ANION GAP: 18 — AB (ref 5–15)
AST: 174 U/L — ABNORMAL HIGH (ref 15–41)
BILIRUBIN TOTAL: 27.5 mg/dL — AB (ref 0.3–1.2)
BUN: 17 mg/dL (ref 6–20)
CALCIUM: 7.4 mg/dL — AB (ref 8.9–10.3)
CO2: 16 mmol/L — ABNORMAL LOW (ref 22–32)
Chloride: 72 mmol/L — ABNORMAL LOW (ref 101–111)
Creatinine, Ser: UNDETERMINED mg/dL (ref 0.61–1.24)
GLUCOSE: 77 mg/dL (ref 65–99)
Potassium: 3.6 mmol/L (ref 3.5–5.1)
Sodium: 106 mmol/L — CL (ref 135–145)
TOTAL PROTEIN: 5.3 g/dL — AB (ref 6.5–8.1)

## 2017-12-24 LAB — RAPID HIV SCREEN (HIV 1/2 AB+AG)
HIV 1/2 Antibodies: NONREACTIVE
HIV-1 P24 ANTIGEN - HIV24: NONREACTIVE

## 2017-12-24 LAB — GLUCOSE, CAPILLARY
Glucose-Capillary: 97 mg/dL (ref 65–99)
Glucose-Capillary: 103 mg/dL — ABNORMAL HIGH (ref 65–99)
Glucose-Capillary: 115 mg/dL — ABNORMAL HIGH (ref 65–99)

## 2017-12-24 LAB — LIPID PANEL
CHOLESTEROL: 348 mg/dL — AB (ref 0–200)
HDL: <10 mg/dL — ABNORMAL LOW (ref >40)
Total CHOL/HDL Ratio: 348 RATIO
Triglycerides: 230 mg/dL — ABNORMAL HIGH (ref <150)
VLDL: 46 mg/dL — ABNORMAL HIGH (ref 0–40)

## 2017-12-24 LAB — URINE DRUG SCREEN, QUALITATIVE (ARMC ONLY)
Amphetamines, Ur Screen: NOT DETECTED
Barbiturates, Ur Screen: NOT DETECTED
Benzodiazepine, Ur Scrn: NOT DETECTED
Cannabinoid 50 Ng, Ur ~~LOC~~: NOT DETECTED
Cocaine Metabolite,Ur ~~LOC~~: NOT DETECTED
MDMA (Ecstasy)Ur Screen: NOT DETECTED
Methadone Scn, Ur: NOT DETECTED
Opiate, Ur Screen: NOT DETECTED
Phencyclidine (PCP) Ur S: NOT DETECTED
Tricyclic, Ur Screen: NOT DETECTED

## 2017-12-24 LAB — HEMOGLOBIN A1C
Hgb A1c MFr Bld: 4.2 % — ABNORMAL LOW (ref 4.8–5.6)
Mean Plasma Glucose: 73.84 mg/dL

## 2017-12-24 LAB — TRIGLYCERIDES: TRIGLYCERIDES: 221 mg/dL — AB (ref <150)

## 2017-12-24 LAB — SODIUM: SODIUM: 109 mmol/L — AB (ref 135–145)

## 2017-12-24 LAB — TSH
TSH: 2.106 u[IU]/mL (ref 0.350–4.500)
TSH: 2.012 u[IU]/mL (ref 0.350–4.500)

## 2017-12-24 LAB — IRON AND TIBC
Iron: 58 ug/dL (ref 45–182)
SATURATION RATIOS: 35 % (ref 17.9–39.5)
TIBC: 167 ug/dL — AB (ref 250–450)
UIBC: 109 ug/dL

## 2017-12-24 LAB — TROPONIN I: TROPONIN I: 0.1 ng/mL — AB (ref <0.03)

## 2017-12-24 LAB — PHOSPHORUS: PHOSPHORUS: UNDETERMINED mg/dL (ref 2.5–4.6)

## 2017-12-24 LAB — SODIUM, URINE, RANDOM: Sodium, Ur: <10 mmol/L

## 2017-12-24 LAB — APTT: aPTT: 43 seconds — ABNORMAL HIGH (ref 24–36)

## 2017-12-24 LAB — CK: Total CK: 78 U/L (ref 49–397)

## 2017-12-24 LAB — MAGNESIUM: Magnesium: 1.4 mg/dL — ABNORMAL LOW (ref 1.7–2.4)

## 2017-12-24 LAB — FERRITIN: FERRITIN: 287 ng/mL (ref 24–336)

## 2017-12-24 LAB — AMMONIA: Ammonia: 140 umol/L — ABNORMAL HIGH (ref 9–35)

## 2017-12-24 LAB — OSMOLALITY: OSMOLALITY: 234 mosm/kg — AB (ref 275–295)

## 2017-12-24 LAB — ACETAMINOPHEN LEVEL: Acetaminophen (Tylenol), Serum: <10 ug/mL — ABNORMAL LOW (ref 10–30)

## 2017-12-24 LAB — PROTIME-INR
INR: 1.12
PROTHROMBIN TIME: 14.3 s (ref 11.4–15.2)

## 2017-12-24 LAB — MRSA PCR SCREENING: MRSA by PCR: NEGATIVE

## 2017-12-24 LAB — FOLATE: Folate: 11.2 ng/mL (ref >5.9)

## 2017-12-24 LAB — LIPASE, BLOOD: Lipase: 31 U/L (ref 11–51)

## 2017-12-24 LAB — ETHANOL

## 2017-12-24 LAB — SALICYLATE LEVEL: Salicylate Lvl: UNDETERMINED mg/dL (ref 2.8–30.0)

## 2017-12-24 LAB — OSMOLALITY, URINE: Osmolality, Ur: 291 mOsm/kg — ABNORMAL LOW (ref 300–900)

## 2017-12-24 MED ORDER — ONDANSETRON HCL 4 MG/2ML IJ SOLN: 4.0000 mg | Freq: Four times a day (QID) | INTRAMUSCULAR | Status: DC | PRN

## 2017-12-24 MED ORDER — SODIUM CHLORIDE 0.9 % IV BOLUS
500.0000 mL | Freq: Once | INTRAVENOUS | Status: AC
  Administered 2017-12-24: 500 mL via INTRAVENOUS

## 2017-12-24 MED ORDER — PENTOXIFYLLINE ER 400 MG PO TBCR
400.0000 mg | EXTENDED_RELEASE_TABLET | Freq: Every day | ORAL | Status: DC
  Administered 2017-12-25 – 2017-12-26 (×2): 400 mg via ORAL
  Filled 2017-12-24 (×3): qty 1

## 2017-12-24 MED ORDER — LACTULOSE 10 GM/15ML PO SOLN: 30.0000 g | Freq: Three times a day (TID) | ORAL | Status: DC

## 2017-12-24 MED ORDER — DEXTROSE 5 % IV SOLN
0.0000 ug/min | INTRAVENOUS | Status: DC
  Administered 2017-12-24: 20 ug/min via INTRAVENOUS
  Administered 2017-12-25 (×2): 30 ug/min via INTRAVENOUS
  Administered 2017-12-27 (×3): 40 ug/min via INTRAVENOUS
  Filled 2017-12-24 (×7): qty 16

## 2017-12-24 MED ORDER — LACTULOSE 10 GM/15ML PO SOLN
30.0000 g | Freq: Three times a day (TID) | ORAL | Status: DC
  Administered 2017-12-24 – 2017-12-26 (×8): 30 g
  Filled 2017-12-24 (×8): qty 60

## 2017-12-24 MED ORDER — RIFAXIMIN 550 MG PO TABS: 550.0000 mg | ORAL_TABLET | Freq: Three times a day (TID) | ORAL | Status: DC

## 2017-12-24 MED ORDER — THIAMINE HCL 100 MG/ML IJ SOLN
500.0000 mg | Freq: Every day | INTRAVENOUS | Status: DC
  Administered 2017-12-24 – 2017-12-26 (×3): 500 mg via INTRAVENOUS
  Filled 2017-12-24 (×5): qty 5

## 2017-12-24 MED ORDER — PROPOFOL 1000 MG/100ML IV EMUL
0.0000 ug/kg/min | INTRAVENOUS | Status: DC
  Administered 2017-12-24: 5 ug/kg/min via INTRAVENOUS
  Administered 2017-12-25 (×2): 30 ug/kg/min via INTRAVENOUS
  Filled 2017-12-24 (×4): qty 100

## 2017-12-24 MED ORDER — LORAZEPAM 2 MG/ML IJ SOLN
1.0000 mg | INTRAMUSCULAR | Status: DC | PRN
  Administered 2017-12-24: 2 mg via INTRAVENOUS
  Filled 2017-12-24: qty 1

## 2017-12-24 MED ORDER — SODIUM CHLORIDE 0.9 % IV SOLN
80.0000 mg | Freq: Once | INTRAVENOUS | Status: AC
  Administered 2017-12-24: 80 mg via INTRAVENOUS
  Filled 2017-12-24: qty 80

## 2017-12-24 MED ORDER — DEXMEDETOMIDINE HCL IN NACL 400 MCG/100ML IV SOLN
0.4000 ug/kg/h | INTRAVENOUS | Status: DC
  Administered 2017-12-24: 0.2 ug/kg/h via INTRAVENOUS
  Filled 2017-12-24: qty 100

## 2017-12-24 MED ORDER — CEFTRIAXONE SODIUM 1 G IJ SOLR
1.0000 g | Freq: Every day | INTRAMUSCULAR | Status: DC
  Administered 2017-12-25: 1 g via INTRAVENOUS
  Filled 2017-12-24: qty 10

## 2017-12-24 MED ORDER — ACETAMINOPHEN 325 MG PO TABS: 650.0000 mg | ORAL_TABLET | Freq: Four times a day (QID) | ORAL | Status: DC | PRN

## 2017-12-24 MED ORDER — SODIUM CHLORIDE 0.9 % IV SOLN
8.0000 mg/h | INTRAVENOUS | Status: DC
  Administered 2017-12-24 – 2017-12-25 (×3): 8 mg/h via INTRAVENOUS
  Filled 2017-12-24 (×2): qty 80

## 2017-12-24 MED ORDER — DEXTROSE-NACL 5-0.9 % IV SOLN
INTRAVENOUS | Status: DC
  Administered 2017-12-24: 1000 mL via INTRAVENOUS
  Filled 2017-12-24: qty 1000

## 2017-12-24 MED ORDER — RIFAXIMIN 200 MG PO TABS
400.0000 mg | ORAL_TABLET | Freq: Three times a day (TID) | ORAL | Status: DC
  Administered 2017-12-24 – 2017-12-26 (×8): 400 mg
  Filled 2017-12-24 (×11): qty 2

## 2017-12-24 MED ORDER — SODIUM CHLORIDE 0.9 % IV SOLN
2.0000 g | Freq: Every day | INTRAVENOUS | Status: DC
  Filled 2017-12-24: qty 20

## 2017-12-24 MED ORDER — DOCUSATE SODIUM 100 MG PO CAPS: 100.0000 mg | ORAL_CAPSULE | Freq: Two times a day (BID) | ORAL | Status: DC

## 2017-12-24 MED ORDER — IPRATROPIUM-ALBUTEROL 0.5-2.5 (3) MG/3ML IN SOLN: 3.0000 mL | RESPIRATORY_TRACT | Status: DC | PRN

## 2017-12-24 MED ORDER — LORAZEPAM 2 MG/ML IJ SOLN: 1.0000 mg | INTRAMUSCULAR | Status: DC | PRN

## 2017-12-24 MED ORDER — FENTANYL CITRATE (PF) 100 MCG/2ML IJ SOLN
25.0000 ug | INTRAMUSCULAR | Status: DC | PRN
  Filled 2017-12-24: qty 2

## 2017-12-24 MED ORDER — CHLORHEXIDINE GLUCONATE 0.12% ORAL RINSE (MEDLINE KIT)
15.0000 mL | Freq: Two times a day (BID) | OROMUCOSAL | Status: DC
  Administered 2017-12-24 – 2017-12-27 (×6): 15 mL via OROMUCOSAL

## 2017-12-24 MED ORDER — PANTOPRAZOLE SODIUM 40 MG IV SOLR
40.0000 mg | Freq: Two times a day (BID) | INTRAVENOUS | Status: DC
Start: 2017-12-28 — End: 2017-12-25

## 2017-12-24 MED ORDER — HEPARIN SODIUM (PORCINE) 5000 UNIT/ML IJ SOLN
5000.0000 [IU] | Freq: Three times a day (TID) | INTRAMUSCULAR | Status: DC
  Administered 2017-12-24: 5000 [IU] via SUBCUTANEOUS
  Filled 2017-12-24: qty 1

## 2017-12-24 MED ORDER — SODIUM CHLORIDE 0.9% FLUSH: 10.0000 mL | INTRAVENOUS | Status: DC | PRN

## 2017-12-24 MED ORDER — NOREPINEPHRINE 4 MG/250ML-% IV SOLN
0.0000 ug/min | INTRAVENOUS | Status: DC
  Administered 2017-12-24: 2 ug/min via INTRAVENOUS

## 2017-12-24 MED ORDER — SODIUM CHLORIDE 0.9 % IV SOLN
2.0000 g | INTRAVENOUS | Status: DC
  Filled 2017-12-24: qty 20

## 2017-12-24 MED ORDER — ADULT MULTIVITAMIN W/MINERALS CH
1.0000 | ORAL_TABLET | Freq: Every day | ORAL | Status: DC
  Administered 2017-12-25: 1
  Filled 2017-12-24: qty 1

## 2017-12-24 MED ORDER — FENTANYL CITRATE (PF) 100 MCG/2ML IJ SOLN
100.0000 ug | Freq: Once | INTRAMUSCULAR | Status: AC
  Administered 2017-12-24: 100 ug via INTRAVENOUS

## 2017-12-24 MED ORDER — MAGNESIUM SULFATE 2 GM/50ML IV SOLN
2.0000 g | Freq: Once | INTRAVENOUS | Status: AC
  Administered 2017-12-24: 2 g via INTRAVENOUS
  Filled 2017-12-24: qty 50

## 2017-12-24 MED ORDER — SODIUM CHLORIDE 0.9 % IV SOLN
1.0000 g | INTRAVENOUS | Status: AC
  Administered 2017-12-24: 1 g via INTRAVENOUS
  Filled 2017-12-24: qty 10

## 2017-12-24 MED ORDER — ACETAMINOPHEN 650 MG RE SUPP: 650.0000 mg | Freq: Four times a day (QID) | RECTAL | Status: DC | PRN

## 2017-12-24 MED ORDER — LACTULOSE 10 GM/15ML PO SOLN
30.0000 g | Freq: Once | ORAL | Status: AC
  Administered 2017-12-24: 30 g via ORAL
  Filled 2017-12-24: qty 60

## 2017-12-24 MED ORDER — FOLIC ACID 1 MG PO TABS: 1.0000 mg | ORAL_TABLET | Freq: Every day | ORAL | Status: DC

## 2017-12-24 MED ORDER — ORAL CARE MOUTH RINSE
15.0000 mL | Freq: Four times a day (QID) | OROMUCOSAL | Status: DC
  Administered 2017-12-24 – 2017-12-27 (×12): 15 mL via OROMUCOSAL

## 2017-12-24 MED ORDER — SODIUM CHLORIDE 0.9 % IV SOLN
1.0000 mg | Freq: Once | INTRAVENOUS | Status: AC
  Administered 2017-12-24: 1 mg via INTRAVENOUS
  Filled 2017-12-24: qty 0.2

## 2017-12-24 MED ORDER — SODIUM CHLORIDE 0.9 % IV BOLUS
1000.0000 mL | INTRAVENOUS | Status: AC
  Administered 2017-12-24: 1000 mL via INTRAVENOUS

## 2017-12-24 MED ORDER — ADULT MULTIVITAMIN W/MINERALS CH: 1.0000 | ORAL_TABLET | Freq: Every day | ORAL | Status: DC

## 2017-12-24 MED ORDER — VITAMIN B-1 100 MG PO TABS: 100.0000 mg | ORAL_TABLET | Freq: Every day | ORAL | Status: DC

## 2017-12-24 MED ORDER — FENTANYL CITRATE (PF) 100 MCG/2ML IJ SOLN
100.0000 ug | INTRAMUSCULAR | Status: DC | PRN
  Administered 2017-12-24: 100 ug via INTRAVENOUS
  Filled 2017-12-24: qty 2

## 2017-12-24 MED ORDER — SODIUM CHLORIDE 3 % IV SOLN
INTRAVENOUS | Status: DC
  Administered 2017-12-24 – 2017-12-25 (×2): 50 mL/h via INTRAVENOUS
  Filled 2017-12-24 (×6): qty 500

## 2017-12-24 MED ORDER — FAMOTIDINE IN NACL 20-0.9 MG/50ML-% IV SOLN
20.0000 mg | Freq: Two times a day (BID) | INTRAVENOUS | Status: DC
  Filled 2017-12-24: qty 50

## 2017-12-24 MED ORDER — POTASSIUM CHLORIDE 10 MEQ/50ML IV SOLN
10.0000 meq | INTRAVENOUS | Status: AC
  Administered 2017-12-24 (×2): 10 meq via INTRAVENOUS
  Filled 2017-12-24 (×2): qty 50

## 2017-12-24 MED ORDER — FENTANYL CITRATE (PF) 100 MCG/2ML IJ SOLN: 100.0000 ug | INTRAMUSCULAR | Status: DC | PRN

## 2017-12-24 MED ORDER — ONDANSETRON HCL 4 MG PO TABS: 4.0000 mg | ORAL_TABLET | Freq: Four times a day (QID) | ORAL | Status: DC | PRN

## 2017-12-24 MED ORDER — FENTANYL 2500MCG IN NS 250ML (10MCG/ML) PREMIX INFUSION
INTRAVENOUS | Status: AC
  Administered 2017-12-24: 11:00:00
  Filled 2017-12-24: qty 250

## 2017-12-24 NOTE — Progress Notes (Signed)
Daughter called x2 for notification and consent for PICC line placement. Calls being forwarded towards voicemail.

## 2017-12-24 NOTE — Consult Note (Signed)
PULMONARY / CRITICAL CARE MEDICINE   Name: Jack Lowe MRN: [id number] DOB: Oct 16, 1960    ADMISSION DATE:  01/17/2018 CONSULTATION DATE:  04/23  PT PROFILE:   86 M with history of alcohol abuse, alcoholic hepatitis, delirium tremens, schizophrenia brought to ED 04/23 by family for AMS, profound jaundice.  Found to be profoundly hyponatremic (Na 106).  Admitted to ICU/SDU.  Suffered apneic respiratory arrest in radiology prior to abdominal ultrasound.  MAJOR EVENTS/TEST RESULTS: 04/23 admission as documented above 04/23 suffered respiratory arrest in radiology.  Intubated 04/23 CT head: No acute findings 04/23 CTAP: Fatty liver.  Otherwise, no acute findings 04/23 Abd [place]: The study is limited due to shadowing bowel gas. No cause for sepsis identified. Hepatic steatosis  INDWELLING DEVICES:: ETT 04/23 >>  PICC (ordered) >>   MICRO DATA: MRSA PCR 04/23 >> NEG  Blood 04/23 >>   ANTIMICROBIALS:  Ceftriaxone 04/23 >>     HISTORY OF PRESENT ILLNESS:   Level 5 caveat.  I have reviewed the notes from the emergency department and the admission history and physical.  PAST MEDICAL HISTORY :  He  has a past medical history of Alcohol abuse, COPD (chronic obstructive pulmonary disease) (HCC), Hypertension, and Schizophrenia (HCC).  PAST SURGICAL HISTORY: He  has a past surgical history that includes Colonoscopy.  Allergies  Allergen Reactions  . Haldol [Haloperidol] Swelling    Tongue swelling  . Toradol [Ketorolac Tromethamine] Swelling    No current facility-administered medications on file prior to encounter.    Current Outpatient Medications on File Prior to Encounter  Medication Sig  . beclomethasone (QVAR) 40 MCG/ACT inhaler Inhale 1 puff into the lungs 2 (two) times daily.  . metoprolol succinate (TOPROL-XL) 25 MG 24 hr tablet Take 25 mg by mouth daily.  . rosuvastatin (CRESTOR) 10 MG tablet Take 10 mg by mouth daily.  . predniSONE (DELTASONE) 20 MG tablet Take 3  tablets (60 mg total) by mouth daily. (Patient not taking: Reported on 05/28/2017)    FAMILY HISTORY:  Not available  SOCIAL HISTORY: He  reports that he has been smoking cigarettes.  He has been smoking about 2.00 packs per day. He uses smokeless tobacco. He reports that he drinks alcohol. He reports that he does not use drugs.  REVIEW OF SYSTEMS:   Level 5 caveat  SUBJECTIVE:    VITAL SIGNS: BP (!) 79/46   Pulse 84   Temp 98.1 F (36.7 C) (Oral)   Resp 20   Ht 5\' 10"  (1.778 m)   Wt 208 lb 15.9 oz (94.8 kg)   SpO2 98%   BMI 29.99 kg/m   HEMODYNAMICS:    VENTILATOR SETTINGS: Vent Mode: PRVC FiO2 (%):  [40 %] 40 % Set Rate:  [16 bmp] 16 bmp Vt Set:  [500 mL] 500 mL PEEP:  [5 cmH20] 5 cmH20 Plateau Pressure:  [20 cmH20] 20 cmH20  INTAKE / OUTPUT: I/O last 3 completed shifts: In: 1158.7 [I.V.:8.7; IV Piggyback:1150] Out: -   PHYSICAL EXAMINATION: General: Very severe jaundice, intubated, sedated.  Prior to intubation was agitated Neuro: Cranial nerves intact, moves all extremities HEENT: NCAT, severe sclericterus Cardiovascular: Regular, no M Lungs: Clear to auscultation and percussion Abdomen: Distended, tympanitic, diminished bowel sounds, NT, no fluid wave Ext: Warm, no edema Skin: Severe jaundice, otherwise no lesions  LABS:  BMET Recent Labs  Lab 2018/01/17 0050 17-Jan-2018 0522 2018/01/17 1055  NA 106* 109* 109*  K 3.6 3.6  --   CL 72* 79*  --  CO2 16* 17*  --   BUN 17 18  --   CREATININE UNABLE TO REPORT DUE TO ICTERUS UNABLE TO REPORT DUE TO ICTERUS FLC  --   GLUCOSE 77 83  --     Electrolytes Recent Labs  Lab 01-20-18 0050 01-20-18 0522  CALCIUM 7.4* 7.0*  MG 1.4*  --   PHOS UNABLE TO REPORT DUE TO ICTERUS  --     CBC Recent Labs  Lab 01-20-18 0050  WBC 22.1*  HGB 9.0*  HCT 26.4*  PLT 171    Coag's Recent Labs  Lab 01-20-18 0050  APTT 43*  INR 1.12    Sepsis Markers Recent Labs  Lab 01-20-18 0050 01-20-18 0522   LATICACIDVEN 2.7* 1.3    ABG Recent Labs  Lab 01-20-18 1118  PHART 7.18*  PCO2ART 51*  PO2ART 70*    Liver Enzymes Recent Labs  Lab 01-20-18 0050  AST 174*  ALT 78*  ALKPHOS 810*  BILITOT 27.5*  ALBUMIN 1.9*    Cardiac Enzymes Recent Labs  Lab 01-20-18 0050  TROPONINI 0.10*    Glucose Recent Labs  Lab 01-20-18 0419  GLUCAP 97    CXR: No acute cardiac or pulmonary findings    ASSESSMENT / PLAN:  NEUROLOGIC A:   Chronic alcohol abuse History of DTs Acute encephalopathy due to hyponatremia Hyperammonemia /hepatic encephalopathy ICU/ventilator associated discomfort P:   RASS goal: -1, -2 PAD protocol with propofol, intermittent fentanyl Lactulose and rifaximin ordered Hypertonic saline protocol High dose thiamine ordered  PULMONARY A: Acute ventilator dependent respiratory failure due to respiratory arrest Smoker with documented history of COPD P:   Vent settings established Vent bundle implemented Daily SBT as indicated Nebulized bronchodilators as needed  CARDIOVASCULAR A:  Hypotension after intubation, likely due to sedation P:  MAP goal >70 mmHg Norepinephrine initiated Received normal saline bolus  RENAL A:   Profound hyponatremia Mild metabolic acidosis Oliguria due to hypotension P:   Hypertonic saline protocol initiated 04/23 Monitor BMET intermittently Monitor I/Os Correct electrolytes as indicated   GASTROINTESTINAL A:   Acute hepatic failure P:   SUP: IV famotidine Consider TFs 04/24  HEMATOLOGIC A:   Macrocytic anemia P:  DVT px: SCDs Monitor CBC intermittently Transfuse per usual guidelines  Folate repletion ordered  INFECTIOUS A:   Leukocytosis with no definite infections identified P:   Monitor temp, WBC count Micro and abx as above   ENDOCRINE A:   High risk of hypoglycemia P:   Monitor CBGs   FAMILY  - Updates: No family at bedside  CCM time: 60 mins The above time includes time  spent in consultation with patient and/or family members and reviewing care plan on multidisciplinary rounds  Billy Fischeravid Simonds, MD PCCM service Mobile [phone] Pager [phone]    21-Sep-2017, 2:14 PM

## 2017-12-24 NOTE — Progress Notes (Signed)
Pharmacist - Prescriber Communication  Ceftriaxone dose modified from 1 gm IV Q24H to 2 gm IV Q24H due to indication based dosing - 2 gm for IAI, bacteremia, OM, meningitis.  Jack Lowe, VermontPharm.D., BCPS Clinical Pharmacist 12/22/2017 09:02

## 2017-12-24 NOTE — ED Notes (Signed)
Lab called with a critical hightroponin of 0.10 and a Na of 106 Dr. York CeriseForbach was notified.

## 2017-12-24 NOTE — Progress Notes (Signed)
CRITICAL VALUE ALERT  Critical Value: Sodium and PH  Date & Time Notied:  12/24/12 Provider Notified: MD Simonds 11:30 Orders Received/Actions taken: IV infusions running

## 2017-12-24 NOTE — ED Notes (Signed)
Sherilyn CooterHenry RN called pharmacy to get the ordered Dextrose 5%-0.9NS.

## 2017-12-24 NOTE — ED Notes (Signed)
Lab called with a critical high lactic of 2.7 Dr. York CeriseForbach was notified.

## 2017-12-24 NOTE — Progress Notes (Signed)
PICC line pulled back 3cm, Tess, RN made aware okay to use PICC line.

## 2017-12-24 NOTE — Progress Notes (Signed)
Peripherally Inserted Central Catheter/Midline Placement  The IV Nurse has discussed with the patient and/or persons authorized to consent for the patient, the purpose of this procedure and the potential benefits and risks involved with this procedure.  The benefits include less needle sticks, lab draws from the catheter, and the patient may be discharged home with the catheter. Risks include, but not limited to, infection, bleeding, blood clot (thrombus formation), and puncture of an artery; nerve damage and irregular heartbeat and possibility to perform a PICC exchange if needed/ordered by physician.  Alternatives to this procedure were also discussed.  Bard Power PICC patient education guide, fact sheet on infection prevention and patient information card has been provided to patient /or left at bedside.    PICC/Midline Placement Documentation  PICC Triple Lumen November 29, 2017 PICC Right Basilic 45 cm 0 cm (Active)  Indication for Insertion or Continuance of Line Vasoactive infusions 04/13/18  5:25 PM  Exposed Catheter (cm) 0 cm 04/13/18  5:25 PM  Site Assessment Clean;Dry;Intact 04/13/18  5:25 PM  Lumen #1 Status Flushed;Saline locked;Blood return noted 04/13/18  5:25 PM  Lumen #2 Status Flushed;Saline locked;Blood return noted 04/13/18  5:25 PM  Lumen #3 Status Flushed;Saline locked;Blood return noted 04/13/18  5:25 PM  Dressing Type Transparent;Securing device 04/13/18  5:25 PM  Dressing Status Clean;Dry;Intact;Antimicrobial disc in place 04/13/18  5:25 PM  Line Care Connections checked and tightened 04/13/18  5:25 PM  Line Adjustment (NICU/IV Team Only) No 04/13/18  5:25 PM  Dressing Intervention New dressing 04/13/18  5:25 PM  Dressing Change Due 12/31/17 04/13/18  5:25 PM    Medical necessity per Dr. Sung AmabileSimonds.    Sissy HoffKendall W Kaven [place] 04/13/18, 5:31 PM

## 2017-12-24 NOTE — Progress Notes (Signed)
Chaplain responded to a code blue in [place]. Chaplain maintained a presence outside the room praying for the Pt and care team. Pt was stabilized. Chaplain departed for a meeting.    07/04/18 0900  Clinical Encounter Type  Visited With Patient  Visit Type Code  Referral From Care management  Spiritual Encounters  Spiritual Needs Prayer

## 2017-12-24 NOTE — ED Notes (Signed)
Pharmacy called to tell RN that they do not have the Dextrose 5%-0.9NS.

## 2017-12-24 NOTE — Progress Notes (Addendum)
MEDICATION RELATED CONSULT NOTE - INITIAL   Pharmacy Consult for sodium monitoring Indication: hypertonic saline  Allergies  Allergen Reactions  . Haldol [Haloperidol] Swelling    Tongue swelling  . Toradol [Ketorolac Tromethamine] Swelling    Patient Measurements: Height: 5\' 10"  (177.8 cm) Weight: 208 lb 15.9 oz (94.8 kg) IBW/kg (Calculated) : 73 Adjusted Body Weight:   Vital Signs: Temp: 97.9 F (36.6 C) (04/23 0700) Temp Source: Axillary (04/23 0700) BP: 119/67 (04/23 0700) Pulse Rate: 100 (04/23 0700) Intake/Output from previous day: 04/22 0701 - 04/23 0700 In: 1158.7 [I.V.:8.7; IV Piggyback:1150] Out: -  Intake/Output from this shift: Total I/O In: 28.3 [I.V.:28.3] Out: -   Labs: Recent Labs    Jan 14, 2018 0050 Jan 14, 2018 0522  WBC 22.1*  --   HGB 9.0*  --   HCT 26.4*  --   PLT 171  --   APTT 43*  --   CREATININE UNABLE TO REPORT DUE TO ICTERUS UNABLE TO REPORT DUE TO ICTERUS FLC  MG 1.4*  --   PHOS UNABLE TO REPORT DUE TO ICTERUS  --   ALBUMIN 1.9*  --   PROT 5.3*  --   AST 174*  --   ALT 78*  --   ALKPHOS 810*  --   BILITOT 27.5*  --    CrCl cannot be calculated (This lab value cannot be used to calculate CrCl because it is not a number: UNABLE TO REPORT DUE TO ICTERUS FLC).   Microbiology: Recent Results (from the past 720 hour(s))  Blood culture (routine x 2)     Status: None (Preliminary result)   Collection Time: 01/14/18  2:52 AM  Result Value Ref Range Status   Specimen Description BLOOD LEFT HAND  Final   Special Requests   Final    BOTTLES DRAWN AEROBIC AND ANAEROBIC Blood Culture results may not be optimal due to an excessive volume of blood received in culture bottles   Culture   Final    NO GROWTH <12 HOURS Performed at [facility], [address], [place], [place] [address]    Report Status PENDING  Incomplete  Blood culture (routine x 2)     Status: None (Preliminary result)   Collection Time: 14-Jan-2018  2:52 AM  Result  Value Ref Range Status   Specimen Description BLOOD BLOOD RIGHT WRIST  Final   Special Requests   Final    BOTTLES DRAWN AEROBIC AND ANAEROBIC Blood Culture results may not be optimal due to an excessive volume of blood received in culture bottles   Culture   Final    NO GROWTH <12 HOURS Performed at [facility], [address], [place], [place] [address]    Report Status PENDING  Incomplete    Medical History: Past Medical History:  Diagnosis Date  . Alcohol abuse   . COPD (chronic obstructive pulmonary disease) (HCC)   . Hypertension   . Schizophrenia (HCC)     Medications:  Infusions:  . cefTRIAXone (ROCEPHIN)  IV    . dexmedetomidine (PRECEDEX) IV infusion 1 mcg/kg/hr (Jan 14, 2018 0746)  . folic acid (FOLVITE) IVPB    . sodium chloride (hypertonic)    . thiamine injection      Assessment: 56 yom cc AMS with noted sodium 106 on admission, improved to 109 after 1 L NS. CCM orders sodium chloride 3% at 50 mL/hr with Q4H sodium starting at 1444. Patient has PIV with 20 g in right wrist and PIV with 18 g in left  hand. Use large vein and small bore needle until (if) CVC placed.  Goal of Therapy:  Per prescribers note discontinue hypertonic saline once Na >120 Monitor for rise of no more than 8 mEq in the first 8 hours or 12 mEq/L per 24 hr to prevent osmotic demyelination  Plan:  Sodium chloride 3% at 50 mL/hr per CCM. Will follow Na as ordered Q4H starting today at 1444.   Carola FrostNathan A Jullie Arps, Pharm.D., BCPS Clinical Pharmacist 12/05/2017,8:57 AM

## 2017-12-24 NOTE — Consult Note (Signed)
Jack Lowe , MD [address], [place], [place], [place], [address] [id number], [place], [place], [place], [address] Phone: [phone]  Fax: [phone]  Consultation  Referring Provider:     Dr Alva Garnet  Primary Care Physician:  System, Pcp Not In Primary Gastroenterologist:  None   Reason for Consultation:    Liver failure  Date of Admission:  12/23/2017 Date of Consultation:  12/06/2017         HPI:   Jack Lowe is a 57 y.o. male with a history of alcohol abuse ,DT's , schizophrenia brought into the hospital with significant jaundice and AMS. Found to have sodium of 106 and admitted to ICU. At the ultrasound he had a respiratory arrest . Prior hospital visits for Alcohol withdrawal. Last drink of alcohol on day of admission    CT scan of the abdomen showed fatty liver. USG abdomen also showed hepatic steatosis.   Labs show elevated Lactic acid, Na 106, chloride of 72 , Co2 16, alk phos 810 , T bilirubin 27.5 . No creatinine available. AST 174 and ALT 78 . Elevated troponin . Wcc 22 , Hb 9 grams with MCV 110 and plateleyt count of 171 INR/Prothrombin Time 1.12.Low magnesium ,low phos   When I went to the room to speak to the patient he was intubated and sedated.  I spoke to a family member who states that he drinks every day multiple drinks and he has done so for many years.  He also takes [place] powders on multiple times a day and has done so for many years.  He has been having diarrhea for the past few days and has required to take Imodium.  There is some mention of some blood in his stools recently.  No clear history of abdominal pain.  The NG tube at this point has clear to yellow bile coming out.  Denies any prior history of jaundice.  No over-the-counter herbal  supplements or medications.  Denies any Tylenol use.  Denies any illegal drug use.  He was feeling fine until yesterday when he started feeling more confused and hence was brought into the hospital.  Past  Medical History:  Diagnosis Date  . Alcohol abuse   . COPD (chronic obstructive pulmonary disease) ([place])   . Hypertension   . Schizophrenia [facility])     Past Surgical History:  Procedure Laterality Date  . COLONOSCOPY      Prior to Admission medications   Medication Sig Start Date End Date Taking? Authorizing Provider  beclomethasone (QVAR) 40 MCG/ACT inhaler Inhale 1 puff into the lungs 2 (two) times daily.   Yes [provider]  metoprolol succinate (TOPROL-XL) 25 MG 24 hr tablet Take 25 mg by mouth daily.   Yes [provider]  rosuvastatin (CRESTOR) 10 MG tablet Take 10 mg by mouth daily.   Yes [provider]  predniSONE (DELTASONE) 20 MG tablet Take 3 tablets (60 mg total) by mouth daily. Patient not taking: Reported on 05/28/2017 04/28/16   Hinda Kehr, MD    Family History  Problem Relation Age of Onset  . Hyperlipidemia Unknown   . Lung cancer Unknown      Social History   Tobacco Use  . Smoking status: Current Every Day Smoker    Packs/day: 2.00    Types: Cigarettes  . Smokeless tobacco: Current User  Substance Use Topics  . Alcohol use: Yes    Alcohol/week: 0.0 oz  . Drug use: No  Allergies as of 12/09/2017 - Review Complete 12/20/2017  Allergen Reaction Noted  . Haldol [haloperidol] Swelling 05/28/2017  . Toradol [ketorolac tromethamine] Swelling 01/09/2016    Review of Systems:    Unable to obtain ROS as he is intubated and sedated.   Physical Exam:  Vital signs in last 24 hours: Temp:  [97.3 F (36.3 C)-98.1 F (36.7 C)] 98.1 F (36.7 C) (04/23 1200) Pulse Rate:  [73-100] 73 (04/23 1445) Resp:  [15-31] 31 (04/23 1445) BP: (67-127)/(39-84) 90/53 (04/23 1445) SpO2:  [94 %-100 %] 100 % (04/23 1445) FiO2 (%):  [40 %] 40 % (04/23 1000) Weight:  [208 lb 15.9 oz (94.8 kg)-214 lb (97.1 kg)] 208 lb 15.9 oz (94.8 kg) (04/23 0400) Last BM Date: 12/23/17 General:   Intubated and sedated unable to provide history. Head:   Normocephalic and atraumatic. Eyes: Icterus present conjunctiva pink.  Constricted reactive to light bilaterally Ears: Cannot assess Neck:  Supple; no masses or thyroidomegaly Lungs: Respirations even and unlabored. Lungs clear to auscultation bilaterally.   No wheezes, crackles, or rhonchi.  Heart:  Regular rate and rhythm;  Without murmur, clicks, rubs or gallops Abdomen:  Soft, nondistended, nontender. Normal bowel sounds. No appreciable masses or hepatomegaly.  No rebound or guarding.  Neurologic:  Alert and oriented x0 . Skin: Appears deeply jaundiced spider angiomas over his chest. Cervical Nodes:  No significant cervical adenopathy. Psych: Cannot assess  LAB RESULTS: Recent Labs    12/21/2017 0050  WBC 22.1*  HGB 9.0*  HCT 26.4*  PLT 171   BMET Recent Labs    12/15/2017 0050 12/02/2017 0522 12/31/2017 1055  NA 106* 109* 109*  K 3.6 3.6  --   CL 72* 79*  --   CO2 16* 17*  --   GLUCOSE 77 83  --   BUN 17 18  --   CREATININE UNABLE TO REPORT DUE TO ICTERUS UNABLE TO REPORT DUE TO ICTERUS FLC  --   CALCIUM 7.4* 7.0*  --    LFT Recent Labs    12/23/2017 0050  PROT 5.3*  ALBUMIN 1.9*  AST 174*  ALT 78*  ALKPHOS 810*  BILITOT 27.5*   PT/INR Recent Labs    12/03/2017 0050  LABPROT 14.3  INR 1.12    STUDIES: Ct Abdomen Pelvis Wo Contrast  Result Date: 12/31/2017 CLINICAL DATA:  Abdominal distension. EXAM: CT ABDOMEN AND PELVIS WITHOUT CONTRAST TECHNIQUE: Multidetector CT imaging of the abdomen and pelvis was performed following the standard protocol without IV contrast. COMPARISON:  None. FINDINGS: Lower chest: Mild bilateral posterior basilar subsegmental atelectasis is noted. Hepatobiliary: No gallstones are noted. Fatty infiltration of the liver is noted. No biliary dilatation is noted. Pancreas: Unremarkable. No pancreatic ductal dilatation or surrounding inflammatory changes. Spleen: Normal in size without focal abnormality. Adrenals/Urinary Tract: Adrenal glands are  unremarkable. Kidneys are normal, without renal calculi, focal lesion, or hydronephrosis. Bladder is unremarkable. Stomach/Bowel: Stomach is within normal limits. Appendix appears normal. No evidence of bowel wall thickening, distention, or inflammatory changes. Vascular/Lymphatic: Aortic atherosclerosis. No enlarged abdominal or pelvic lymph nodes. Reproductive: Prostate is unremarkable. Other: No abnormal fluid collection is noted. Small fat containing right inguinal hernia is noted. Musculoskeletal: No acute or significant osseous findings. IMPRESSION: Fatty infiltration of the liver. No other significant abnormality seen in the abdomen or pelvis. Electronically Signed   By: Marijo Conception, M.D.   On: 12/20/2017 10:26   Dg Abd 1 View  Result Date: 12/30/2017 CLINICAL DATA:  Nasogastric tube placement.  EXAM: ABDOMEN - 1 VIEW COMPARISON:  Radiographs of September 23, 2017. FINDINGS: The bowel gas pattern is normal. Distal tip of nasogastric tube is seen in proximal stomach. No radio-opaque calculi or other significant radiographic abnormality are seen. IMPRESSION: Distal tip of nasogastric tube seen in proximal stomach. No definite evidence of bowel obstruction. Electronically Signed   By: Marijo Conception, M.D.   On: 12/20/2017 12:50   Ct Head Wo Contrast  Result Date: 12/04/2017 CLINICAL DATA:  Status post cardiac arrest with altered mental status EXAM: CT HEAD WITHOUT CONTRAST TECHNIQUE: Contiguous axial images were obtained from the base of the skull through the vertex without intravenous contrast. COMPARISON:  None. FINDINGS: Brain: The ventricles are normal in size and configuration. There is no intracranial mass, hemorrhage, extra-axial fluid collection, or midline shift. The gray-white compartments appear normal. No evident acute infarct. Vascular: No hyperdense vessel. No appreciable vascular calcification evident. Skull: The bony calvarium appears intact. Sinuses/Orbits: There is patchy opacity in  several ethmoid air cells. There is slight mucosal thickening in the anterior sphenoid sinus regions. Other visualized paranasal sinuses are clear. Frontal sinuses are somewhat hypoplastic. Orbits appear symmetric bilaterally. Other: Mastoid air cells are clear. IMPRESSION: No mass or hemorrhage. Gray-white compartments appear normal. Mild paranasal sinus disease noted. Electronically Signed   By: Lowella Grip III M.D.   On: 12/28/2017 10:16   US Abdomen Complete  Result Date: 12/12/2017 CLINICAL DATA:  Sepsis EXAM: ABDOMEN ULTRASOUND COMPLETE COMPARISON:  None. FINDINGS: Gallbladder: No gallstones or wall thickening visualized. No sonographic Murphy sign noted by sonographer. Common bile duct: Diameter: 3.3 mm Liver: Hepatic steatosis. No focal mass. Portal vein is patent on color Doppler imaging with normal direction of blood flow towards the liver. IVC: No abnormality visualized. Pancreas: The pancreas was not visualized. Spleen: Size and appearance within normal limits. Right Kidney: Length: 14.1 cm. Echogenicity within normal limits. No mass or hydronephrosis visualized. Left Kidney: Length: 13.6 cm. Echogenicity within normal limits. No mass or hydronephrosis visualized. Abdominal aorta: Not well visualized. Other findings: None. IMPRESSION: The study is limited due to shadowing bowel gas. No cause for sepsis identified. Hepatic steatosis. Electronically Signed   By: Dorise Bullion III M.D   On: 12/05/2017 12:51   Dg Chest Port 1 View  Result Date: 12/21/2017 CLINICAL DATA:  Status post intubation. EXAM: PORTABLE CHEST 1 VIEW COMPARISON:  Radiographs of September 23, 2017. FINDINGS: Stable cardiomediastinal silhouette. Endotracheal tube is seen projected over tracheal air shadow with distal tip 5 cm above the carina. No pneumothorax or pleural effusion is noted. No acute pulmonary disease is noted. Bony thorax is unremarkable. IMPRESSION: Endotracheal tube in grossly good position. No acute  cardiopulmonary abnormality seen. Electronically Signed   By: Marijo Conception, M.D.   On: 12/03/2017 10:05   [place] Ekg Site Rite  Result Date: 12/22/2017 If Site Rite image not attached, placement could not be confirmed due to current cardiac rhythm.     Impression / Plan:   Jack Lowe is a 57 y.o. y/o male with severely low electrolytes (NA,Phos,Mg, Cl)- likely pre renal. H/o alcohol abuse with normal INR suggesting he does not have acute liver failure. He has an elevated WCC , elevated transaminases and T bilirubin very likely from alcoholic hepatitis. He possibly has renal failure , although we cannot see his creatnine results- he has a low co2 suggesting metabolic acidosis likely from AKI.  He also has a history of long standing use of NSAIDs and his  low hemoglobin may be related to blood loss from NSAID use.  At this point of time there is no coffee-ground material coming from his NG tube rather it is yellow and bilious in nature indicating no active upper GI bleed.  His metabolic disturbances may be due to a combination of the effects of alcohol, AK I, NSAID usage, dehydration from diarrhea.   Plan   1. Check for acute hepatitis A/B/C/HSV/CMV,EBV,VZV 2. Check CK,GGT,TSH 3. Watch for withdrawal 4. IF mental status not improving suggest CT head to rule out sub dural bleed which is not uncommon in alcoholics 5. IV thiamine to prevent wernickes encephelopathy 6. Management of low sodium per ICU/Renal  7. I would suggest we start him on Trental with the assumption that he has renal failure at 400 mg once daily as it particularly may help prevent hepatorenal syndrome. I do note his HB is relatively lower than baseline- watch for bleeding . It would be anotther reason to avoid steroids if there is a reason to suspect blood loss from GI tract  8. IV PPI .  9. Doppler veins of the liver to rule out portal vein thrombosis   Overall very poor prognosis, alcoholic hepatitis has a mortality  rate of over 50% without taking into account the other medical issues that he has that are active at this point of time.  Thank you for involving me in the care of this patient.      LOS: 0 days   Jack Bellows, MD  12/05/2017, 3:18 PM

## 2017-12-24 NOTE — Progress Notes (Signed)
Admitted by Dr.Diamond this morning 57 year old male patient with history of alcohol abuse, alcoholic hepatitis, delirium tremens, schizophrenia was brought to the emergency room by family for altered mental status and profound jaundice.  Patient found to be hyponatremic and admitted to the ICU he had apneic arrest in the radiology department prior to abdominal ultrasound.  Currently patient on ventilator.  I agree with the management by intensivist attending. Continue vent bundle. Hypertonic saline for hyponatremia. Monitor electrolytes, wbc count, LFT .

## 2017-12-24 NOTE — ED Provider Notes (Signed)
[facility] Emergency Department Provider Note  ____________________________________________   First MD Initiated Contact with Patient 12/09/2017 [phone]     (approximate)  I have reviewed the triage vital signs and the nursing notes.   HISTORY  Chief Complaint Emesis; Jaundice; and Altered Mental Status  Level 5 caveat:  history/ROS limited by acute/critical illness and/or intoxication  HPI Jack Lowe is a 57 y.o. male with medical history as listed below who presents by EMS for altered mental status.  According to paramedics, the family reports that he has been trying to detox from alcohol on his own over the last couple weeks but he has been drinking over the last couple of days.  He states he has had at least 12 beers today.  He has changed color over the last 3 days to a yellow color that his wife says is new but has gradually been getting worse in the last 3 days and is now severe.  His overall status is severe and he does have a history of delirium tremens but has no history of liver failure at least as reported by the family.  The patient is clearly altered and confused but denies any acute pain at this time specifically including denying chest pain, shortness of breath, and abdominal pain as well as nausea.  No additional details are available at this time.  Past Medical History:  Diagnosis Date  . Alcohol abuse   . COPD (chronic obstructive pulmonary disease) (HCC)   . Hypertension   . Schizophrenia [facility](HCC)     Patient Active Problem List   Diagnosis Date Noted  . Liver failure (HCC) 12/17/2017  . DTs (delirium tremens) (HCC) 09/23/2017  . Alcohol withdrawal (HCC) 05/28/2017  . COPD (chronic obstructive pulmonary disease) (HCC) 01/10/2016  . Delirium tremens (HCC) 01/09/2016    Past Surgical History:  Procedure Laterality Date  . COLONOSCOPY      Prior to Admission medications   Medication Sig Start Date End Date Taking? Authorizing  Provider  beclomethasone (QVAR) 40 MCG/ACT inhaler Inhale 1 puff into the lungs 2 (two) times daily.   Yes [provider]  metoprolol succinate (TOPROL-XL) 25 MG 24 hr tablet Take 25 mg by mouth daily.   Yes [provider]  rosuvastatin (CRESTOR) 10 MG tablet Take 10 mg by mouth daily.   Yes [provider]  predniSONE (DELTASONE) 20 MG tablet Take 3 tablets (60 mg total) by mouth daily. Patient not taking: Reported on 05/28/2017 04/28/16   Loleta RoseForbach, Sabriah Hobbins, MD    Allergies Haldol [haloperidol] and Toradol [ketorolac tromethamine]  Family History  Problem Relation Age of Onset  . Hyperlipidemia Unknown   . Lung cancer Unknown     Social History Social History   Tobacco Use  . Smoking status: Current Every Day Smoker    Packs/day: 2.00    Types: Cigarettes  . Smokeless tobacco: Current User  Substance Use Topics  . Alcohol use: Yes    Alcohol/week: 0.0 oz  . Drug use: No    Review of Systems Level 5 caveat:  history/ROS limited by acute/critical illness and/or intoxication ____________________________________________   PHYSICAL EXAM:  ED Triage Vitals  Enc Vitals Group     BP 12/09/2017 0047 104/63     Pulse Rate 12/31/2017 0047 93     Resp 12/23/2017 0047 (!) 26     Temp 12/04/2017 0047 97.7 F (36.5 C)     Temp Source 12/26/2017 0047 Oral     SpO2  2018/01/14 0041 99 %     Weight 01/14/18 0049 97.1 kg (214 lb)     Height 01-14-18 0049 1.753 m (5\' 9" )     Head Circumference --      Peak Flow --      Pain Score 2018-01-14 0049 0     Pain Loc --      Pain Edu? --      Excl. in GC? --      Constitutional: Alert but confused.  Answers simple questions but has difficulty following commands.  Severely jaundiced. Eyes: Severe scleral icterus.  Pupils are equal and reactive bilaterally Head: Atraumatic. Nose: No congestion/rhinnorhea. Mouth/Throat: Mucous membranes are moist. Neck: No stridor.  No meningeal signs.   Cardiovascular: Normal rate, regular  rhythm. Good peripheral circulation. Grossly normal heart sounds. Respiratory: Normal respiratory effort.  No retractions. Lungs CTAB. Gastrointestinal: Tense but nontender abdomen, questionable distention versus just normal habitus for this patient. Musculoskeletal: No lower extremity tenderness nor edema. No gross deformities of extremities. Neurologic:  Normal speech and language. No gross focal neurologic deficits are appreciated.  Skin:  Skin is warm, dry, and severely jaundiced.  ____________________________________________   LABS (all labs ordered are listed, but only abnormal results are displayed)  Labs Reviewed  AMMONIA - Abnormal; Notable for the following components:      Result Value   Ammonia 140 (*)    All other components within normal limits  LACTIC ACID, PLASMA - Abnormal; Notable for the following components:   Lactic Acid, Venous 2.7 (*)    All other components within normal limits  COMPREHENSIVE METABOLIC PANEL - Abnormal; Notable for the following components:   Sodium 106 (*)    Chloride 72 (*)    CO2 16 (*)    Calcium 7.4 (*)    Total Protein 5.3 (*)    Albumin 1.9 (*)    AST 174 (*)    ALT 78 (*)    Alkaline Phosphatase 810 (*)    Total Bilirubin 27.5 (*)    Anion gap 18 (*)    All other components within normal limits  TROPONIN I - Abnormal; Notable for the following components:   Troponin I 0.10 (*)    All other components within normal limits  ACETAMINOPHEN LEVEL - Abnormal; Notable for the following components:   Acetaminophen (Tylenol), Serum <10 (*)    All other components within normal limits  CBC WITH DIFFERENTIAL/PLATELET - Abnormal; Notable for the following components:   WBC 22.1 (*)    RBC 2.39 (*)    Hemoglobin 9.0 (*)    HCT 26.4 (*)    MCV 110.6 (*)    MCH 37.5 (*)    RDW 15.7 (*)    nRBC 1 (*)    Neutro Abs 20.1 (*)    All other components within normal limits  APTT - Abnormal; Notable for the following components:   aPTT 43  (*)    All other components within normal limits  MAGNESIUM - Abnormal; Notable for the following components:   Magnesium 1.4 (*)    All other components within normal limits  CULTURE, BLOOD (ROUTINE X 2)  CULTURE, BLOOD (ROUTINE X 2)  ETHANOL  LIPASE, BLOOD  SALICYLATE LEVEL  PROTIME-INR  LACTIC ACID, PLASMA  URINALYSIS, ROUTINE W REFLEX MICROSCOPIC  URINE DRUG SCREEN, QUALITATIVE ([facility] ONLY)  HEPATITIS PANEL, ACUTE  LIPID PANEL  PHOSPHORUS  OSMOLALITY  SODIUM, URINE, RANDOM  OSMOLALITY, URINE   ____________________________________________  EKG  ED ECG REPORT  ILoleta Rose, the attending physician, personally viewed and interpreted this ECG.  Date: 01/17/18 EKG Time: 00: 46 Rate: 95 Rhythm: normal sinus rhythm QRS Axis: normal Intervals: Borderline intraventricular conduction delay ST/T Wave abnormalities: normal Narrative Interpretation: no evidence of acute ischemia  ____________________________________________  RADIOLOGY   ED MD interpretation:  No indication for emergent imaging  Official radiology report(s): No results found.  ____________________________________________   PROCEDURES  Critical Care performed: Yes, see critical care procedure note(s)   Procedure(s) performed:   .Critical Care Performed by: Loleta Rose, MD Authorized by: Loleta Rose, MD   Critical care provider statement:    Critical care time (minutes):  45   Critical care time was exclusive of:  Separately billable procedures and treating other patients   Critical care was necessary to treat or prevent imminent or life-threatening deterioration of the following conditions:  Hepatic failure   Critical care was time spent personally by me on the following activities:  Development of treatment plan with patient or surrogate, discussions with consultants, evaluation of patient's response to treatment, examination of patient, obtaining history from patient or surrogate,  ordering and performing treatments and interventions, ordering and review of laboratory studies, ordering and review of radiographic studies, pulse oximetry, re-evaluation of patient's condition and review of old charts     ____________________________________________   INITIAL IMPRESSION / ASSESSMENT AND PLAN / ED COURSE  As part of my medical decision making, I reviewed the following data within the electronic MEDICAL RECORD NUMBER Nursing notes reviewed and incorporated, Labs reviewed , EKG interpreted , Discussed with admitting physician  and A consult was requested and obtained from this/these consultant(s) (ICU)    Differential diagnosis includes, but is not limited to, acute hepatitis or other form of acute liver failure, hyperammonemia with encephalopathy, diabetic ketoacidosis, acute biliary obstruction, etc.  Based on the current presentation I believe that the patient has acute hepatitis due to his alcoholism, Of note he has no tenderness to palpation which makes biliary colic less likely.  I have ordered a broad lab work-up that includes standard labs such as CMP, CBC, liver function tests, acute hepatitis panel, salicylate level, acetaminophen level, ammonia level, ethanol level, lactic acid, magnesium, troponin, as well as a urinalysis and urine drug screen.  No indication for emergent imaging.  I will get an EKG with the patient on the monitor and continuous pulse oximeter.  He will need to be watched closely given his altered mental status.  I also ordered CIWA protocol.  Clinical Course as of Dec 24 253  Tue 17-Jan-2018  0100 Resp(!): 26 [CF]  0140 Ammonia(!): 140 [CF]  0140 Lactic Acid, Venous(!!): 2.7 [CF]  0141 Ammonia significantly elevated at 140, have ordered lactulose 30 g by mouth and is oriented enough that he should be able to tolerate it.  Lactic acid is elevated at 2.7.  I have ordered 1 L of normal saline.  The rest of his labs are still pending.   [CF]  0217 INR:  1.12 [CF]  0217 Troponin I(!!): 0.10 [CF]  0225 Negative ethanol is actually somewhat concerning in the setting of chronic severe alcohol abuse, continuing CIWA protocol  Alcohol, Ethyl (B): <10 [CF]  0226 Sodium(!!): 106 [CF]  0226 Creatinine: UNABLE TO REPORT DUE TO ICTERUS [CF]  0226 Total Bilirubin(!!): 27.5 [CF]  0226 Anion gap(!): 18 [CF]  0226 AST(!): 174 [CF]  0226 ALT(!): 78 [CF]  0252 I called and spoke with the ICU doctor on-call and we  discussed the case in detail including all the lab results.  Agreed with my management thus far including lactulose, 1L NS IV, and ceftriaxone 1 g IV.  He recommended only judicious fluids such as D5 normal saline at [facility] which can be adjusted later after labs are repeated after the fluid bolus.  He agreed with my plan for magnesium sulfate 2 g IV.  He agreed with admission to the ICU for careful monitoring.  I also added on at his request urine sodium and urine osmolality.  I have updated the patient and his girlfriend to let him know that he is critically ill and needs to be admitted.  He has had no change in mental status after taking the lactulose, he is still alert and able to follow commands but is still confused.  I discussed the case with Dr. Sheryle Hail with the hospitalist service who requested blood cultures and we discussed all the other management and he agrees with the plan as well.   [CF]    Clinical Course User Index [CF] Loleta Rose, MD    ____________________________________________  FINAL CLINICAL IMPRESSION(S) / ED DIAGNOSES  Final diagnoses:  Hepatic encephalopathy (HCC)  Hyperammonemia (HCC)  Elevated lactic acid level  Hyperbilirubinemia  Hyponatremia  Leukocytosis, unspecified type  Acute liver failure without hepatic coma  Elevated troponin I level  Hypomagnesemia  Alcoholic ketoacidosis     MEDICATIONS GIVEN DURING THIS VISIT:  Medications  sodium chloride 0.9 % bolus 1,000 mL (1,000 mLs Intravenous New Bag/Given  12/14/2017 0200)  cefTRIAXone (ROCEPHIN) 1 g in sodium chloride 0.9 % 100 mL IVPB (has no administration in time range)  magnesium sulfate IVPB 2 g 50 mL (has no administration in time range)  dextrose 5 %-0.9 % sodium chloride infusion (has no administration in time range)  lactulose (CHRONULAC) 10 GM/15ML solution 30 g (30 g Oral Given 12/10/2017 0151)     ED Discharge Orders    None       Note:  This document was prepared using Dragon voice recognition software and may include unintentional dictation errors.    Loleta Rose, MD 12/18/2017 [phone]

## 2017-12-24 NOTE — H&P (Signed)
Jack Lowe is an 57 y.o. male.   Chief Complaint: Altered mental status HPI: The patient with past medical history of alcohol abuse and hypertension presents to the emergency department with altered mental status.  The patient was confused.  Ammonia level was found to be high.  The patient is also had multiple episodes of non-bloody nonbilious emesis.  CT of the head showed no acute intracranial process.  However laboratory evaluation was significant for transaminitis, hyperbilirubinemia, hyponatremia and lactic acidosis.  The patient was given a bolus of normal saline in the emergency department.  He was also given lactulose and ceftriaxone due to concern for intra-abdominal infection.  ICU staff was notified in the emergency department staff called the hospitalist service for admission.  Past Medical History:  Diagnosis Date  . Alcohol abuse   . COPD (chronic obstructive pulmonary disease) ([place])   . Hypertension   . Schizophrenia [facility])     Past Surgical History:  Procedure Laterality Date  . COLONOSCOPY      Family History  Problem Relation Age of Onset  . Hyperlipidemia Unknown   . Lung cancer Unknown    Social History:  reports that he has been smoking cigarettes.  He has been smoking about 2.00 packs per day. He uses smokeless tobacco. He reports that he drinks alcohol. He reports that he does not use drugs.  Allergies:  Allergies  Allergen Reactions  . Haldol [Haloperidol] Swelling    Tongue swelling  . Toradol [Ketorolac Tromethamine] Swelling    Medications Prior to Admission  Medication Sig Dispense Refill  . beclomethasone (QVAR) 40 MCG/ACT inhaler Inhale 1 puff into the lungs 2 (two) times daily.    . metoprolol succinate (TOPROL-XL) 25 MG 24 hr tablet Take 25 mg by mouth daily.    . rosuvastatin (CRESTOR) 10 MG tablet Take 10 mg by mouth daily.    . predniSONE (DELTASONE) 20 MG tablet Take 3 tablets (60 mg total) by mouth daily. (Patient not taking: Reported on  05/28/2017) 15 tablet 0    Results for orders placed or performed during the hospital encounter of 12/23/2017 (from the past 48 hour(s))  Ammonia     Status: Abnormal   Collection Time: 12/07/2017 12:50 AM  Result Value Ref Range   Ammonia 140 (H) 9 - 35 umol/L    Comment: HEMOLYSIS AT THIS LEVEL MAY AFFECT RESULT ICTERUS AT THIS LEVEL MAY AFFECT RESULT Performed at [facility], [address] [place], [place] [address]   Lactic acid, plasma     Status: Abnormal   Collection Time: 12/29/2017 12:50 AM  Result Value Ref Range   Lactic Acid, Venous 2.7 (HH) 0.5 - 1.9 mmol/L    Comment: CRITICAL RESULT CALLED TO, READ BACK BY AND VERIFIED WITH Early Osmond RN AT [phone] 12/05/2017. MSS Performed at [facility], [address] [place], [place] [address]   Comprehensive metabolic panel     Status: Abnormal   Collection Time: 12/21/2017 12:50 AM  Result Value Ref Range   Sodium 106 (LL) 135 - 145 mmol/L    Comment: CRITICAL RESULT CALLED TO, READ BACK BY AND VERIFIED WITH Early Osmond RN AT 0200 12/19/2017. MSS    Potassium 3.6 3.5 - 5.1 mmol/L    Comment: HEMOLYSIS AT THIS LEVEL MAY AFFECT RESULT   Chloride 72 (L) 101 - 111 mmol/L   CO2 16 (L) 22 - 32 mmol/L   Glucose, Bld 77 65 - 99 mg/dL   BUN 17 6 -  20 mg/dL   Creatinine, Ser UNABLE TO REPORT DUE TO ICTERUS 0.61 - 1.24 mg/dL   Calcium 7.4 (L) 8.9 - 10.3 mg/dL   Total Protein 5.3 (L) 6.5 - 8.1 g/dL   Albumin 1.9 (L) 3.5 - 5.0 g/dL   AST 174 (H) 15 - 41 U/L   ALT 78 (H) 17 - 63 U/L    Comment: ICTERUS AT THIS LEVEL MAY AFFECT RESULT   Alkaline Phosphatase 810 (H) 38 - 126 U/L   Total Bilirubin 27.5 (HH) 0.3 - 1.2 mg/dL    Comment: CRITICAL RESULT CALLED TO, READ BACK BY AND VERIFIED WITH HENRY RIVERA RN AT 0200 12/13/2017. MSS    GFR calc non Af Amer NOT CALCULATED >60 mL/min   GFR calc Af Amer NOT CALCULATED >60 mL/min    Comment: (NOTE) The eGFR has been calculated using the CKD EPI equation. This calculation has not  been validated in all clinical situations. eGFR's persistently <60 mL/min signify possible Chronic Kidney Disease.    Anion gap 18 (H) 5 - 15    Comment: Performed at [facility], [address] [place], [place] [address]  Ethanol     Status: None   Collection Time: 12/02/2017 12:50 AM  Result Value Ref Range   Alcohol, Ethyl (B) <10 <10 mg/dL    Comment:        LOWEST DETECTABLE LIMIT FOR SERUM ALCOHOL IS 10 mg/dL FOR MEDICAL PURPOSES ONLY Performed at [facility], [address] [place], [place] [address]   Lipase, blood     Status: None   Collection Time: 12/28/2017 12:50 AM  Result Value Ref Range   Lipase 31 11 - 51 U/L    Comment: Performed at [facility], [address] [place], [place] [address]  Troponin I     Status: Abnormal   Collection Time: 12/10/2017 12:50 AM  Result Value Ref Range   Troponin I 0.10 (HH) <0.03 ng/mL    Comment: CRITICAL RESULT CALLED TO, READ BACK BY AND VERIFIED WITH HENRY RIVERA RN AT 0200 12/17/2017. MSS Performed at [facility], [address], [place] [address]   Acetaminophen level     Status: Abnormal   Collection Time: 12/03/2017 12:50 AM  Result Value Ref Range   Acetaminophen (Tylenol), Serum <10 (L) 10 - 30 ug/mL    Comment:        THERAPEUTIC CONCENTRATIONS VARY SIGNIFICANTLY. A RANGE OF 10-30 ug/mL MAY BE AN EFFECTIVE CONCENTRATION FOR MANY PATIENTS. HOWEVER, SOME ARE BEST TREATED AT CONCENTRATIONS OUTSIDE THIS RANGE. ACETAMINOPHEN CONCENTRATIONS >150 ug/mL AT 4 HOURS AFTER INGESTION AND >50 ug/mL AT 12 HOURS AFTER INGESTION ARE OFTEN ASSOCIATED WITH TOXIC REACTIONS. Performed at [facility], [address] [place], [place] [address]   Salicylate level     Status: None   Collection Time: 12/04/2017 12:50 AM  Result Value Ref Range   Salicylate Lvl UNABLE TO REPORT DUE TO ICTERUS 2.8 - 30.0 mg/dL    Comment: Performed at [facility], [address]  [place],  [address]  CBC with Differential     Status: Abnormal   Collection Time: 12/07/2017 12:50 AM  Result Value Ref Range   WBC 22.1 (H) 3.8 - 10.6 K/uL   RBC 2.39 (L) 4.40 - 5.90 MIL/uL   Hemoglobin 9.0 (L) 13.0 - 18.0 g/dL   HCT 26.4 (L) 40.0 - 52.0 %   MCV 110.6 (H) 80.0 - 100.0 fL   MCH 37.5 (H)  26.0 - 34.0 pg   MCHC 34.0 32.0 - 36.0 g/dL   RDW 15.7 (H) 11.5 - 14.5 %   Platelets 171 150 - 440 K/uL   Neutrophils Relative % 84 %   Lymphocytes Relative 6 %   Monocytes Relative 3 %   Eosinophils Relative 0 %   Basophils Relative 0 %   Band Neutrophils 7 %   Metamyelocytes Relative 0 %   Myelocytes 0 %   Promyelocytes Relative 0 %   Blasts 0 %   nRBC 1 (H) 0 /100 WBC   Other 0 %   Neutro Abs 20.1 (H) 1.4 - 6.5 K/uL   Lymphs Abs 1.3 1.0 - 3.6 K/uL   Monocytes Absolute 0.7 0.2 - 1.0 K/uL   Eosinophils Absolute 0.0 0 - 0.7 K/uL   Basophils Absolute 0.0 0 - 0.1 K/uL   RBC Morphology POLYCHROMASIA PRESENT     Comment: MIXED RBC POPULATION Performed at [facility], [address] [place], [place] [address]   Protime-INR     Status: None   Collection Time: 12/14/2017 12:50 AM  Result Value Ref Range   Prothrombin Time 14.3 11.4 - 15.2 seconds   INR 1.12     Comment: Performed at [facility], [address] [place], [place] [address]  APTT     Status: Abnormal   Collection Time: 12/22/2017 12:50 AM  Result Value Ref Range   aPTT 43 (H) 24 - 36 seconds    Comment:        IF BASELINE aPTT IS ELEVATED, SUGGEST PATIENT RISK ASSESSMENT BE USED TO DETERMINE APPROPRIATE ANTICOAGULANT THERAPY. Performed at [facility], [address] [place], [place] [address]   Magnesium     Status: Abnormal   Collection Time: 12/07/2017 12:50 AM  Result Value Ref Range   Magnesium 1.4 (L) 1.7 - 2.4 mg/dL    Comment: Performed at [facility], [address] [place], [place] [address]  Lipid panel     Status: Abnormal   Collection Time: 12/10/2017  12:50 AM  Result Value Ref Range   Cholesterol 348 (H) 0 - 200 mg/dL    Comment: ICTERUS AT THIS LEVEL MAY AFFECT RESULT   Triglycerides 230 (H) <150 mg/dL    Comment: ICTERUS AT THIS LEVEL MAY AFFECT RESULT   HDL <10 (L) >40 mg/dL   Total CHOL/HDL Ratio 348.0 RATIO   VLDL 46 (H) 0 - 40 mg/dL   LDL Cholesterol NOT CALCULATED 0 - 99 mg/dL    Comment: Performed at [facility], [address] [place], [place] [address]  Phosphorus     Status: None   Collection Time: 12/10/2017 12:50 AM  Result Value Ref Range   Phosphorus UNABLE TO REPORT DUE TO ICTERUS 2.5 - 4.6 mg/dL    Comment: Performed at [facility], [address] [place], [place] [address]  Osmolality     Status: Abnormal   Collection Time: 12/14/2017 12:50 AM  Result Value Ref Range   Osmolality 234 (LL) 275 - 295 mOsm/kg    Comment: CRITICAL RESULT CALLED TO, READ BACK BY AND VERIFIED WITH: TONI WALKER AT 0432 ON 12/26/2017 [place]. Performed at [facility], [address] [place], [place] [address]   Glucose, capillary     Status: None   Collection Time: 12/09/2017  4:19 AM  Result Value Ref Range   Glucose-Capillary 97 65 - 99 mg/dL  Lactic acid, plasma     Status: None  Collection Time: 12/19/2017  5:22 AM  Result Value Ref Range   Lactic Acid, Venous 1.3 0.5 - 1.9 mmol/L    Comment: Performed at [facility], [address] [place], [place] [address]   No results found.  Review of Systems  Constitutional: Negative for chills and fever.  HENT: Negative for sore throat and tinnitus.   Eyes: Negative for blurred vision and redness.  Respiratory: Negative for cough and shortness of breath.   Cardiovascular: Negative for chest pain, palpitations, orthopnea and PND.  Gastrointestinal: Negative for abdominal pain, diarrhea, nausea and vomiting.  Genitourinary: Negative for dysuria, frequency and urgency.  Musculoskeletal: Negative for joint pain and myalgias.  Skin: Negative for rash.        No lesions  Neurological: Negative for speech change, focal weakness and weakness.  Endo/Heme/Allergies: Does not bruise/bleed easily.       No temperature intolerance  Psychiatric/Behavioral: Negative for depression and suicidal ideas.    Blood pressure 116/84, pulse 99, temperature (!) 97.3 F (36.3 C), temperature source Oral, resp. rate 20, height _0  (1.778 m), weight 94.8 kg (208 lb 15.9 oz), SpO2 96 %. Physical Exam  Vitals reviewed. Constitutional: He appears well-developed and well-nourished. No distress.  HENT:  Head: Normocephalic and atraumatic.  Mouth/Throat: Oropharynx is clear and moist.  Eyes: Pupils are equal, round, and reactive to light. Conjunctivae and EOM are normal. Scleral icterus is present.  Neck: Normal range of motion. Neck supple. No JVD present. No tracheal deviation present. No thyromegaly present.  Cardiovascular: Normal rate, regular rhythm and normal heart sounds. Exam reveals no gallop and no friction rub.  No murmur heard. Respiratory: Effort normal and breath sounds normal. No respiratory distress.  GI: Soft. Bowel sounds are normal. He exhibits no distension. There is no tenderness.  Genitourinary:  Genitourinary Comments: Deferred  Musculoskeletal: Normal range of motion. He exhibits no edema.  Lymphadenopathy:    He has no cervical adenopathy.  Neurological: He is alert. No cranial nerve deficit.  Mild confusion but follows commands  Skin: Skin is warm and dry. No rash noted.  jaundice  Psychiatric: He has a normal mood and affect. He is agitated.  Altered mental status thus difficult to assess judgement and thought content     Assessment/Plan This is a 57 year old male admitted for alcoholic hepatitis. 1.  Hepatitis: Alcohol induced; the patient has been fluid responsive and has started to clear his lactic acidosis.  His anion gap has improved and his sodium level has also increased.  Continue fluid resuscitation.  Chloride is  improving as expected.  Rule out obstructive jaundice.  Consult gastroenterology.  CIWA scale for alcohol withdrawal.  Dexmedetomidine while in ICU 2.  Sepsis: The patient meets criteria via leukocytosis, tachypnea and intermittent tachycardia 3.  Hyponatremia: Hypovolemic; increase intravascular volume with normal saline. 4.  COPD: Stable; Albuterol as needed. 5.  DVT prophylaxis: Heparin 6.  GI prophylaxis: None The patient is a full code.  Time spent on admission orders and critical care approximately 45 minutes.  Discussed with [facility] telemedicine  Harrie Foreman, MD 12/10/2017, 6:16 AM

## 2017-12-24 NOTE — ED Triage Notes (Signed)
Pt arrived to the ED via EMS from home for altered mental status, jaundice and alcohol withdrawal. EMS reports that the Pt's wife communicated to them that the Pt has been trying to detox from drinking at home but continues to drink. Pt reports that he had 12 beers today with a history of alcoholism. Pt is Alert and confused upon arrival to the hospital.

## 2017-12-25 ENCOUNTER — Inpatient Hospital Stay: Payer: Medicaid - Out of State

## 2017-12-25 DIAGNOSIS — K7290 Hepatic failure, unspecified without coma: Secondary | ICD-10-CM

## 2017-12-25 DIAGNOSIS — R579 Shock, unspecified: Secondary | ICD-10-CM

## 2017-12-25 DIAGNOSIS — J9601 Acute respiratory failure with hypoxia: Secondary | ICD-10-CM

## 2017-12-25 DIAGNOSIS — N179 Acute kidney failure, unspecified: Secondary | ICD-10-CM

## 2017-12-25 LAB — BASIC METABOLIC PANEL
Anion gap: 10 (ref 5–15)
Anion gap: 7 (ref 5–15)
Anion gap: 13 (ref 5–15)
BUN: 22 mg/dL — ABNORMAL HIGH (ref 6–20)
BUN: 17 mg/dL (ref 6–20)
BUN: 19 mg/dL (ref 6–20)
CHLORIDE: 89 mmol/L — AB (ref 101–111)
CO2: 18 mmol/L — AB (ref 22–32)
CO2: 12 mmol/L — ABNORMAL LOW (ref 22–32)
CO2: 13 mmol/L — ABNORMAL LOW (ref 22–32)
Calcium: 6.7 mg/dL — ABNORMAL LOW (ref 8.9–10.3)
Calcium: 5.2 mg/dL — CL (ref 8.9–10.3)
Calcium: 6.6 mg/dL — ABNORMAL LOW (ref 8.9–10.3)
Chloride: 117 mmol/L — ABNORMAL HIGH (ref 101–111)
Chloride: 96 mmol/L — ABNORMAL LOW (ref 101–111)
Creatinine, Ser: 1.87 mg/dL — ABNORMAL HIGH (ref 0.61–1.24)
Creatinine, Ser: 1.39 mg/dL — ABNORMAL HIGH (ref 0.61–1.24)
Creatinine, Ser: 1.66 mg/dL — ABNORMAL HIGH (ref 0.61–1.24)
GFR calc Af Amer: >60 mL/min (ref >60)
GFR calc Af Amer: 52 mL/min — ABNORMAL LOW (ref >60)
GFR calc non Af Amer: 39 mL/min — ABNORMAL LOW (ref >60)
GFR calc non Af Amer: 56 mL/min — ABNORMAL LOW (ref >60)
GFR calc non Af Amer: 45 mL/min — ABNORMAL LOW (ref >60)
GFR, EST AFRICAN AMERICAN: 45 mL/min — AB (ref >60)
Glucose, Bld: 112 mg/dL — ABNORMAL HIGH (ref 65–99)
Glucose, Bld: 183 mg/dL — ABNORMAL HIGH (ref 65–99)
Glucose, Bld: 180 mg/dL — ABNORMAL HIGH (ref 65–99)
POTASSIUM: 2.9 mmol/L — AB (ref 3.5–5.1)
Potassium: 2.6 mmol/L — CL (ref 3.5–5.1)
Potassium: 3.1 mmol/L — ABNORMAL LOW (ref 3.5–5.1)
SODIUM: 117 mmol/L — AB (ref 135–145)
Sodium: 136 mmol/L (ref 135–145)
Sodium: 122 mmol/L — ABNORMAL LOW (ref 135–145)

## 2017-12-25 LAB — CBC
HEMATOCRIT: 25.5 % — AB (ref 40.0–52.0)
Hemoglobin: 8.6 g/dL — ABNORMAL LOW (ref 13.0–18.0)
MCH: 37.4 pg — AB (ref 26.0–34.0)
MCHC: 33.7 g/dL (ref 32.0–36.0)
MCV: 110.9 fL — AB (ref 80.0–100.0)
PLATELETS: 197 10*3/uL (ref 150–440)
RBC: 2.3 MIL/uL — ABNORMAL LOW (ref 4.40–5.90)
RDW: 16.1 % — AB (ref 11.5–14.5)
WBC: 19.3 10*3/uL — ABNORMAL HIGH (ref 3.8–10.6)

## 2017-12-25 LAB — HEPATIC FUNCTION PANEL
ALBUMIN: 1.7 g/dL — AB (ref 3.5–5.0)
ALK PHOS: 705 U/L — AB (ref 38–126)
ALT: 69 U/L — AB (ref 17–63)
AST: 207 U/L — AB (ref 15–41)
BILIRUBIN TOTAL: 24.4 mg/dL — AB (ref 0.3–1.2)
Bilirubin, Direct: 15.2 mg/dL — ABNORMAL HIGH (ref 0.1–0.5)
Indirect Bilirubin: 9.2 mg/dL — ABNORMAL HIGH (ref 0.3–0.9)
TOTAL PROTEIN: 4.6 g/dL — AB (ref 6.5–8.1)

## 2017-12-25 LAB — HEPATITIS PANEL, ACUTE
HCV Ab: <0.1 s/co ratio (ref 0.0–0.9)
HEP B C IGM: NEGATIVE
Hep A IgM: NEGATIVE
Hepatitis B Surface Ag: NEGATIVE

## 2017-12-25 LAB — GLUCOSE, CAPILLARY
Glucose-Capillary: 115 mg/dL — ABNORMAL HIGH (ref 65–99)
Glucose-Capillary: 104 mg/dL — ABNORMAL HIGH (ref 65–99)
Glucose-Capillary: 131 mg/dL — ABNORMAL HIGH (ref 65–99)
Glucose-Capillary: 125 mg/dL — ABNORMAL HIGH (ref 65–99)

## 2017-12-25 LAB — SODIUM
Sodium: 113 mmol/L — CL (ref 135–145)
Sodium: 115 mmol/L — CL (ref 135–145)

## 2017-12-25 LAB — PROCALCITONIN: PROCALCITONIN: 5.7 ng/mL

## 2017-12-25 LAB — VITAMIN B12: Vitamin B-12: 4831 pg/mL — ABNORMAL HIGH (ref 180–914)

## 2017-12-25 MED ORDER — SODIUM CHLORIDE 0.9 % IV SOLN
2.0000 g | Freq: Two times a day (BID) | INTRAVENOUS | Status: DC
  Administered 2017-12-25 – 2017-12-27 (×5): 2 g via INTRAVENOUS
  Filled 2017-12-25 (×6): qty 2

## 2017-12-25 MED ORDER — SODIUM CHLORIDE 0.9 % IV BOLUS: 500.0000 mL | Freq: Once | INTRAVENOUS | Status: DC

## 2017-12-25 MED ORDER — LACTATED RINGERS IV SOLN
INTRAVENOUS | Status: DC
  Administered 2017-12-25: 21:00:00 via INTRAVENOUS

## 2017-12-25 MED ORDER — LORAZEPAM 2 MG/ML IJ SOLN
2.0000 mg | INTRAMUSCULAR | Status: DC | PRN
  Administered 2017-12-25 – 2017-12-26 (×2): 2 mg via INTRAVENOUS
  Filled 2017-12-25 (×2): qty 1

## 2017-12-25 MED ORDER — HYDROCORTISONE NA SUCCINATE PF 100 MG IJ SOLR
50.0000 mg | Freq: Four times a day (QID) | INTRAMUSCULAR | Status: DC
  Administered 2017-12-25 – 2017-12-26 (×4): 50 mg via INTRAVENOUS
  Filled 2017-12-25 (×4): qty 2

## 2017-12-25 MED ORDER — FENTANYL CITRATE (PF) 100 MCG/2ML IJ SOLN: 50.0000 ug | INTRAMUSCULAR | Status: DC | PRN

## 2017-12-25 MED ORDER — PANTOPRAZOLE SODIUM 40 MG IV SOLR
40.0000 mg | Freq: Two times a day (BID) | INTRAVENOUS | Status: DC
  Administered 2017-12-25 – 2017-12-27 (×5): 40 mg via INTRAVENOUS
  Filled 2017-12-25 (×5): qty 40

## 2017-12-25 MED ORDER — STERILE WATER FOR INJECTION IJ SOLN
INTRAMUSCULAR | Status: AC
  Administered 2017-12-25: 14:00:00
  Filled 2017-12-25: qty 10

## 2017-12-25 MED ORDER — VASOPRESSIN 20 UNIT/ML IV SOLN
0.0300 [IU]/min | INTRAVENOUS | Status: DC
  Administered 2017-12-25 (×2): 0.03 [IU]/min via INTRAVENOUS
  Filled 2017-12-25 (×4): qty 2

## 2017-12-25 MED ORDER — DEXMEDETOMIDINE HCL IN NACL 400 MCG/100ML IV SOLN
0.4000 ug/kg/h | INTRAVENOUS | Status: DC
  Administered 2017-12-25: 1 ug/kg/h via INTRAVENOUS
  Administered 2017-12-25 – 2017-12-26 (×3): 1.2 ug/kg/h via INTRAVENOUS
  Administered 2017-12-26: 0.6 ug/kg/h via INTRAVENOUS
  Administered 2017-12-26 – 2017-12-27 (×9): 1.2 ug/kg/h via INTRAVENOUS
  Filled 2017-12-25 (×16): qty 100

## 2017-12-25 MED ORDER — POTASSIUM CHLORIDE 10 MEQ/50ML IV SOLN
10.0000 meq | INTRAVENOUS | Status: AC
  Administered 2017-12-25 (×6): 10 meq via INTRAVENOUS
  Filled 2017-12-25 (×6): qty 50

## 2017-12-25 MED ORDER — VANCOMYCIN HCL IN DEXTROSE 1-5 GM/200ML-% IV SOLN
1000.0000 mg | Freq: Two times a day (BID) | INTRAVENOUS | Status: DC
  Administered 2017-12-25: 1000 mg via INTRAVENOUS
  Filled 2017-12-25 (×3): qty 200

## 2017-12-25 MED ORDER — VANCOMYCIN HCL 10 G IV SOLR
1500.0000 mg | Freq: Once | INTRAVENOUS | Status: AC
  Administered 2017-12-25: 1500 mg via INTRAVENOUS
  Filled 2017-12-25: qty 1500

## 2017-12-25 NOTE — Progress Notes (Signed)
Pharmacy Antibiotic Note  Jack Lowe is a 57 y.o. male admitted on 12/06/2017 with sepsis.  Patient with history significant for chronic alcohol abuse presenting with severe hyponatremia. Patient is intubated and requiring pressors. Pharmacy has been consulted for cefepime and vancomycin dosing.  Plan: Cefepime 2g IV Q12hr.   Vancomycin 1500mg  IV x 1 followed by vancomycin 1000mg  IV Q12hr for goal trough of 15-20., Will obtain trough as clinically indicated.   Height: 5\' 10"  (177.8 cm) Weight: 212 lb 15.4 oz (96.6 kg) IBW/kg (Calculated) : 73  Temp (24hrs), Avg:98.6 F (37 C), Min:98.1 F (36.7 C), Max:99.2 F (37.3 C)  Recent Labs  Lab 12/14/2017 0050 12/09/2017 0522 12/09/2017 1803 12/25/17 0500  WBC 22.1*  --   --  19.3*  CREATININE UNABLE TO REPORT DUE TO ICTERUS UNABLE TO REPORT DUE TO ICTERUS FLC UNABLE TO REPORT DUE TO ICTERUS 1.87*  LATICACIDVEN 2.7* 1.3  --   --     Estimated Creatinine Clearance: 52.6 mL/min (A) (by C-G formula based on SCr of 1.87 mg/dL (H)).    Allergies  Allergen Reactions  . Haldol [Haloperidol] Swelling    Tongue swelling  . Toradol [Ketorolac Tromethamine] Swelling    Antimicrobials this admission: Ceftriaxone 4/23 >> 4/24  Cefepime 4/24 >>  Vancomycin 4/24 >>   Dose adjustments this admission: 4/24 Ceftriaxone transitioned to 1g by CCM   Microbiology results: 4/23 BCx: no growth x 1 day  4/24 Sputum: sent   4/23 MRSA PCR: negative   Thank you for allowing pharmacy to be a part of this patient's care.  Yanely Mast L 12/25/2017 3:46 PM

## 2017-12-25 NOTE — Consult Note (Signed)
[facility]  CONSULT NOTE    Date: 12/25/2017                  Patient Name:  Jack Lowe  MRN: [id number]  DOB: 1961/08/07  Age / Sex: 57 y.o., male         PCP: System, Pcp Not In                 Service Requesting Consult: Dr. Alva Garnet                 Reason for Consult: Hyponatremia            History of Present Illness: Jack Lowe is a 57 y.o. white male with schizophrenia, hypertension, COPD, alcohol abuse , who was admitted to [facility] on 12/08/2017 for Hepatic encephalopathy ([name]) [U93.23] Alcoholic ketoacidosis [F57.3] Hypomagnesemia [E83.42] Hyperbilirubinemia [E80.6] Hyponatremia [E87.1] Hyperammonemia ([name]) [E72.20] Elevated troponin I level [R74.8] Elevated lactic acid level [R79.89] Acute liver failure without hepatic coma [K72.00] Leukocytosis, unspecified type [D72.829] Liver failure ([name]) [K72.90]  Daughters at bedside. Patient intubated and sedated. History from chart. Admitted with a serum sodium of 106. Was binging on alcohol with 12 beers per day. Confused on admission. Determined to be in hepatic encephalopathy. Went into respiratory arrest and was intubated.   History of COPD and smoking tobacco.   Placed on norepinephrine and vasopressin.    Started on hypertonic saline. Sodium has now improved to 115   Medications: Outpatient medications: Medications Prior to Admission  Medication Sig Dispense Refill Last Dose  . beclomethasone (QVAR) 40 MCG/ACT inhaler Inhale 1 puff into the lungs 2 (two) times daily.   12/23/2017 at Unknown time  . metoprolol succinate (TOPROL-XL) 25 MG 24 hr tablet Take 25 mg by mouth daily.   12/23/2017 at Unknown time  . rosuvastatin (CRESTOR) 10 MG tablet Take 10 mg by mouth daily.   12/23/2017 at Unknown time  . predniSONE (DELTASONE) 20 MG tablet Take 3 tablets (60 mg total) by mouth daily. (Patient not taking: Reported on 05/28/2017) 15 tablet 0 Not Taking at Unknown time    Current  medications: Current Facility-Administered Medications  Medication Dose Route Frequency Provider Last Rate Last Dose  . ceFEPIme (MAXIPIME) 2 g in sodium chloride 0.9 % 100 mL IVPB  2 g Intravenous Q12H Coffee, Donna Christen, RPH 200 mL/hr at 12/25/17 1602 2 g at 12/25/17 1602  . chlorhexidine gluconate (MEDLINE KIT) (PERIDEX) 0.12 % solution 15 mL  15 mL Mouth Rinse BID Wilhelmina Mcardle, MD   15 mL at 12/25/17 0745  . dexmedetomidine (PRECEDEX) 400 MCG/100ML (4 mcg/mL) infusion  0.4-1.2 mcg/kg/hr Intravenous Titrated Wilhelmina Mcardle, MD 29 mL/hr at 12/25/17 1117 1.2 mcg/kg/hr at 12/25/17 1117  . fentaNYL (SUBLIMAZE) injection 50 mcg  50 mcg Intravenous Q2H PRN Wilhelmina Mcardle, MD      . hydrocortisone sodium succinate (SOLU-CORTEF) 100 MG injection 50 mg  50 mg Intravenous Q6H Wilhelmina Mcardle, MD   50 mg at 12/25/17 1605  . ipratropium-albuterol (DUONEB) 0.5-2.5 (3) MG/3ML nebulizer solution 3 mL  3 mL Nebulization Q4H PRN Wilhelmina Mcardle, MD      . lactulose (CHRONULAC) 10 GM/15ML solution 30 g  30 g Per Tube TID Wilhelmina Mcardle, MD   30 g at 12/25/17 1605  . LORazepam (ATIVAN) injection 2 mg  2 mg Intravenous Q1H PRN Wilhelmina Mcardle, MD   2 mg at 12/25/17 1115  . MEDLINE mouth rinse  15 mL Mouth Rinse QID Wilhelmina Mcardle, MD   15 mL at 12/25/17 1235  . multivitamin with minerals tablet 1 tablet  1 tablet Per Tube Daily Wilhelmina Mcardle, MD   1 tablet at 12/25/17 1040  . norepinephrine (LEVOPHED) 16 mg in dextrose 5 % 250 mL (0.064 mg/mL) infusion  0-40 mcg/min Intravenous Titrated Wilhelmina Mcardle, MD 37.5 mL/hr at 12/25/17 0942 40 mcg/min at 12/25/17 0942  . pantoprazole (PROTONIX) injection 40 mg  40 mg Intravenous Q12H Wilhelmina Mcardle, MD   40 mg at 12/25/17 1234  . pentoxifylline (TRENTAL) CR tablet 400 mg  400 mg Oral Q breakfast Jonathon Bellows, MD   400 mg at 12/25/17 1040  . rifaximin (XIFAXAN) tablet 400 mg  400 mg Per Tube TID Saundra Shelling, MD   400 mg at 12/25/17 1605  . sodium  chloride (hypertonic) 3 % solution   Intravenous Continuous Wilhelmina Mcardle, MD 50 mL/hr at 12/25/17 0700 50 mL/hr at 12/25/17 0700  . sodium chloride 0.9 % bolus 500 mL  500 mL Intravenous Once Wilhelmina Mcardle, MD      . sodium chloride flush (NS) 0.9 % injection 10-40 mL  10-40 mL Intracatheter PRN Wilhelmina Mcardle, MD      . thiamine 526m in normal saline (586m IVPB  500 mg Intravenous Daily SiWilhelmina McardleMD   Stopped at 12/25/17 1135  . vancomycin (VANCOCIN) 1,500 mg in sodium chloride 0.9 % 500 mL IVPB  1,500 mg Intravenous Once Mody, Sital, MD      . vancomycin (VANCOCIN) IVPB 1000 mg/200 mL premix  1,000 mg Intravenous Q12H Mody, Sital, MD      . vasopressin (PITRESSIN) 40 Units in sodium chloride 0.9 % 250 mL (0.16 Units/mL) infusion  0.03 Units/min Intravenous Continuous SiWilhelmina McardleMD 11.3 mL/hr at 12/25/17 0908 0.03 Units/min at 12/25/17 0908      Allergies: Allergies  Allergen Reactions  . Haldol [Haloperidol] Swelling    Tongue swelling  . Toradol [Ketorolac Tromethamine] Swelling      Past Medical History: Past Medical History:  Diagnosis Date  . Alcohol abuse   . [place] obstructive [place] disease) [place]  . Hypertension   . [facility]     Past Surgical History: Past Surgical History:  Procedure Laterality Date  . COLONOSCOPY       Family History: Family History  Problem Relation Age of Onset  . Hyperlipidemia Unknown   . Lung cancer Unknown      Social History: Social History   Socioeconomic History  . Marital status: Single    Spouse name: Not on file  . Number of children: Not on file  . Years of education: Not on file  . Highest education level: Not on file  Occupational History  . Not on file  Social Needs  . Financial resource strain: Not on file  . Food insecurity:    Worry: Not on file    Inability: Not on file  . Transportation needs:    Medical: Not on file    Non-medical: Not on file  Tobacco Use   . Smoking status: Current Every Day Smoker    Packs/day: 2.00    Types: Cigarettes  . Smokeless tobacco: Current User  Substance and Sexual Activity  . Alcohol use: Yes    Alcohol/week: 0.0 oz  . Drug use: No  . Sexual activity: Not on file  Lifestyle  . Physical activity:  Days per week: Not on file    Minutes per session: Not on file  . Stress: Not on file  Relationships  . Social connections:    Talks on phone: Not on file    Gets together: Not on file    Attends religious service: Not on file    Active member of club or organization: Not on file    Attends meetings of clubs or organizations: Not on file    Relationship status: Not on file  . Intimate partner violence:    Fear of current or ex partner: Not on file    Emotionally abused: Not on file    Physically abused: Not on file    Forced sexual activity: Not on file  Other Topics Concern  . Not on file  Social History Narrative  . Not on file     Review of Systems: Review of Systems  Unable to perform ROS: Critical illness  Constitutional: Negative for chills, diaphoresis, fever, malaise/fatigue and weight loss.  Skin: Negative for itching and rash.    Vital Signs: Blood pressure (!) 84/45, pulse 87, temperature 99 F (37.2 C), temperature source Axillary, resp. rate (!) 22, height 5' 10"  (1.778 m), weight 96.6 kg (212 lb 15.4 oz), SpO2 97 %.  Weight trends: Filed Weights   12/12/2017 0049 12/25/2017 0400 12/25/17 0500  Weight: 97.1 kg (214 lb) 94.8 kg (208 lb 15.9 oz) 96.6 kg (212 lb 15.4 oz)    Physical Exam: General: Critically Ill  Head: ETT OGT  Eyes: +icterus  Neck: obese  Lungs:  Clear to auscultation, PRVC 35%  Heart: Regular rate and rhythm  Abdomen:  Soft, +ascites  Extremities: 1+ peripheral edema.  Neurologic: Intubated and sedated  Skin: +jaundice        Lab results: Basic Metabolic Panel: Recent Labs  Lab 12/14/2017 0050 12/07/2017 0522  12/31/2017 1803 12/29/2017 2230  12/25/17 0500 12/25/17 1222  NA 106* 109*   < > 112* 113* 117* 115*  K 3.6 3.6  --  3.3*  --  2.9*  --   CL 72* 79*  --  81*  --  89*  --   CO2 16* 17*  --  18*  --  18*  --   GLUCOSE 77 83  --  99  --  112*  --   BUN 17 18  --  20  --  22*  --   CREATININE UNABLE TO REPORT DUE TO ICTERUS UNABLE TO REPORT DUE TO ICTERUS FLC  --  UNABLE TO REPORT DUE TO ICTERUS  --  1.87*  --   CALCIUM 7.4* 7.0*  --  7.1*  --  6.7*  --   MG 1.4*  --   --   --   --   --   --   PHOS UNABLE TO REPORT DUE TO ICTERUS  --   --   --   --   --   --    < > = values in this interval not displayed.    Liver Function Tests: Recent Labs  Lab 12/07/2017 0050 12/25/17 0500  AST 174* 207*  ALT 78* 69*  ALKPHOS 810* 705*  BILITOT 27.5* 24.4*  PROT 5.3* 4.6*  ALBUMIN 1.9* 1.7*   Recent Labs  Lab 12/23/2017 0050  LIPASE 31   Recent Labs  Lab 12/17/2017 0050  AMMONIA 140*    CBC: Recent Labs  Lab 12/04/2017 0050 12/25/17 0500  WBC 22.1* 19.3*  NEUTROABS 20.1*  --  HGB 9.0* 8.6*  HCT 26.4* 25.5*  MCV 110.6* 110.9*  PLT 171 197    Cardiac Enzymes: Recent Labs  Lab 12/06/2017 0050 12/15/2017 1923  CKTOTAL  --  78  TROPONINI 0.10*  --     BNP: Invalid input(s): POCBNP  CBG: Recent Labs  Lab 12/09/2017 1927 12/29/2017 2317 12/25/17 0325 12/25/17 0756 12/25/17 1200  GLUCAP 103* 115* 115* 104* 131*    Microbiology: Results for orders placed or performed during the hospital encounter of 12/25/2017  Blood culture (routine x 2)     Status: None (Preliminary result)   Collection Time: 12/03/2017  2:52 AM  Result Value Ref Range Status   Specimen Description BLOOD LEFT HAND  Final   Special Requests   Final    BOTTLES DRAWN AEROBIC AND ANAEROBIC Blood Culture results may not be optimal due to an excessive volume of blood received in culture bottles   Culture   Final    NO GROWTH 1 DAY Performed at [facility], [address] [place], [place] [address]    Report Status PENDING   Incomplete  Blood culture (routine x 2)     Status: None (Preliminary result)   Collection Time: 12/23/2017  2:52 AM  Result Value Ref Range Status   Specimen Description BLOOD BLOOD RIGHT WRIST  Final   Special Requests   Final    BOTTLES DRAWN AEROBIC AND ANAEROBIC Blood Culture results may not be optimal due to an excessive volume of blood received in culture bottles   Culture   Final    NO GROWTH 1 DAY Performed at [facility], [address] [place], [place] [address]    Report Status PENDING  Incomplete  MRSA PCR Screening     Status: None   Collection Time: 12/08/2017 11:27 AM  Result Value Ref Range Status   MRSA by PCR NEGATIVE NEGATIVE Final    Comment:        The GeneXpert MRSA Assay (FDA approved for NASAL specimens only), is one component of a comprehensive MRSA colonization surveillance program. It is not intended to diagnose MRSA infection nor to guide or monitor treatment for MRSA infections. Performed at [facility], [address] [place], [place] [address]     Coagulation Studies: Recent Labs    12/13/2017 0050  LABPROT 14.3  INR 1.12    Urinalysis: No results for input(s): COLORURINE, LABSPEC, PHURINE, GLUCOSEU, HGBUR, BILIRUBINUR, KETONESUR, PROTEINUR, UROBILINOGEN, NITRITE, LEUKOCYTESUR in the last 72 hours.  Invalid input(s): APPERANCEUR    Imaging: Ct Abdomen Pelvis Wo Contrast  Result Date: 12/23/2017 CLINICAL DATA:  Abdominal distension. EXAM: CT ABDOMEN AND PELVIS WITHOUT CONTRAST TECHNIQUE: Multidetector CT imaging of the abdomen and pelvis was performed following the standard protocol without IV contrast. COMPARISON:  None. FINDINGS: Lower chest: Mild bilateral posterior basilar subsegmental atelectasis is noted. Hepatobiliary: No gallstones are noted. Fatty infiltration of the liver is noted. No biliary dilatation is noted. Pancreas: Unremarkable. No pancreatic ductal dilatation or surrounding inflammatory changes. Spleen:  Normal in size without focal abnormality. Adrenals/Urinary Tract: Adrenal glands are unremarkable. Kidneys are normal, without renal calculi, focal lesion, or hydronephrosis. Bladder is unremarkable. Stomach/Bowel: Stomach is within normal limits. Appendix appears normal. No evidence of bowel wall thickening, distention, or inflammatory changes. Vascular/Lymphatic: Aortic atherosclerosis. No enlarged abdominal or pelvic lymph nodes. Reproductive: Prostate is unremarkable. Other: No abnormal fluid collection is noted. Small fat containing right inguinal hernia is noted. Musculoskeletal: No acute or significant osseous findings. IMPRESSION: Fatty  infiltration of the liver. No other significant abnormality seen in the abdomen or pelvis. Electronically Signed   By: Marijo Conception, M.D.   On: 12/06/2017 10:26   Dg Abd 1 View  Result Date: 12/12/2017 CLINICAL DATA:  Nasogastric tube placement. EXAM: ABDOMEN - 1 VIEW COMPARISON:  Radiographs of September 23, 2017. FINDINGS: The bowel gas pattern is normal. Distal tip of nasogastric tube is seen in proximal stomach. No radio-opaque calculi or other significant radiographic abnormality are seen. IMPRESSION: Distal tip of nasogastric tube seen in proximal stomach. No definite evidence of bowel obstruction. Electronically Signed   By: Marijo Conception, M.D.   On: 12/03/2017 12:50   Ct Head Wo Contrast  Result Date: 12/06/2017 CLINICAL DATA:  Status post cardiac arrest with altered mental status EXAM: CT HEAD WITHOUT CONTRAST TECHNIQUE: Contiguous axial images were obtained from the base of the skull through the vertex without intravenous contrast. COMPARISON:  None. FINDINGS: Brain: The ventricles are normal in size and configuration. There is no intracranial mass, hemorrhage, extra-axial fluid collection, or midline shift. The gray-white compartments appear normal. No evident acute infarct. Vascular: No hyperdense vessel. No appreciable vascular calcification evident.  Skull: The bony calvarium appears intact. Sinuses/Orbits: There is patchy opacity in several ethmoid air cells. There is slight mucosal thickening in the anterior sphenoid sinus regions. Other visualized paranasal sinuses are clear. Frontal sinuses are somewhat hypoplastic. Orbits appear symmetric bilaterally. Other: Mastoid air cells are clear. IMPRESSION: No mass or hemorrhage. Gray-white compartments appear normal. Mild paranasal sinus disease noted. Electronically Signed   By: Lowella Grip III M.D.   On: 12/04/2017 10:16   US Abdomen Complete  Result Date: 12/20/2017 CLINICAL DATA:  Sepsis EXAM: ABDOMEN ULTRASOUND COMPLETE COMPARISON:  None. FINDINGS: Gallbladder: No gallstones or wall thickening visualized. No sonographic Murphy sign noted by sonographer. Common bile duct: Diameter: 3.3 mm Liver: Hepatic steatosis. No focal mass. Portal vein is patent on color Doppler imaging with normal direction of blood flow towards the liver. IVC: No abnormality visualized. Pancreas: The pancreas was not visualized. Spleen: Size and appearance within normal limits. Right Kidney: Length: 14.1 cm. Echogenicity within normal limits. No mass or hydronephrosis visualized. Left Kidney: Length: 13.6 cm. Echogenicity within normal limits. No mass or hydronephrosis visualized. Abdominal aorta: Not well visualized. Other findings: None. IMPRESSION: The study is limited due to shadowing bowel gas. No cause for sepsis identified. Hepatic steatosis. Electronically Signed   By: Dorise Bullion III M.D   On: 12/02/2017 12:51   Dg Chest Port 1 View  Result Date: 12/25/2017 CLINICAL DATA:  COPD, hepatic failure and alcohol withdrawal. Current smoker. Intubated patient. EXAM: PORTABLE CHEST 1 VIEW COMPARISON:  Chest x-ray of December 24, 2017 FINDINGS: The left lung is well-expanded. There is minimal atelectasis or scarring at the left base. On the right there is mild volume loss with persistent increased density largely obscuring  the hemidiaphragm. There is a trace of pleural fluid on the right. There is no pneumothorax. The endotracheal tube tip projects 2.9 cm above the carina. The esophagogastric tube tip and proximal port project below the GE junction. The right-sided PICC line tip projects over the distal third of the SVC. IMPRESSION: Persistent increased density at the right lung base may reflect atelectasis or pneumonia. Probable trace right pleural effusion. The support tubes are in reasonable position. Electronically Signed   By: David  Martinique M.D.   On: 12/25/2017 10:23   Dg Chest Port 1 View  Result Date: 12/02/2017 CLINICAL  DATA:  57 y/o  M; PICC line placement. EXAM: PORTABLE CHEST 1 VIEW COMPARISON:  12/09/2017 chest radiograph. FINDINGS: Endotracheal tube 1.7 cm above the carina. Enteric tube tip in gastric body. Right PICC line tip projects over right atrium mild reticular opacities of the lungs are stable. No focal consolidation. No pleural effusion or pneumothorax identified. Bones are unremarkable. IMPRESSION: Right PICC line tip projects over right atrium. Stable mild reticular opacities of lungs, possibly vascular congestion. No consolidation. Electronically Signed   By: Kristine Garbe M.D.   On: 12/07/2017 18:03   Dg Chest Port 1 View  Result Date: 12/15/2017 CLINICAL DATA:  Status post intubation. EXAM: PORTABLE CHEST 1 VIEW COMPARISON:  Radiographs of September 23, 2017. FINDINGS: Stable cardiomediastinal silhouette. Endotracheal tube is seen projected over tracheal air shadow with distal tip 5 cm above the carina. No pneumothorax or pleural effusion is noted. No acute pulmonary disease is noted. Bony thorax is unremarkable. IMPRESSION: Endotracheal tube in grossly good position. No acute cardiopulmonary abnormality seen. Electronically Signed   By: Marijo Conception, M.D.   On: 12/29/2017 10:05   [place] Ekg Site Rite  Result Date: 12/31/2017 If Site Rite image not attached, placement could not be  confirmed due to current cardiac rhythm.  [place] Ekg Site Rite  Result Date: 12/18/2017 If Site Rite image not attached, placement could not be confirmed due to current cardiac rhythm.     Assessment & Plan: Jack Lowe is a 57 y.o. white male with schizophrenia, hypertension, COPD, alcohol abuse , who was admitted to [facility] on 12/31/2017.   1. Acute Renal Failure: baseline creatinine 0.5 09/24/2017. Oliuric.  Concerning for development of hepatorenal syndrome.  - Agree with GI input. If no improvement: start midodrine, albumin and octreotide.  - Low threshold for renal replacement therapy.  2. Hyponatremia: 106 on admission. Currently on hypertonic saline. With hypervolemia - Correction of sodium is appropriate  - Continue hypertonic saline.   3. Hypokalemia:   - status post IV potassium replacement.   4. Acute hepatitis: - systemic steroids - pentoxifylline - MELD score of 30. Which is a 52.6% mortality in the next 3 months.  - Appreciate GI input.   LOS: 1 Rendell Thivierge 4/24/20194:15 PM

## 2017-12-25 NOTE — Procedures (Signed)
Oral Intubation Procedure Note  This procedure was performed 04/23  Indications: Respiratory insufficiency Consent: Unable to obtain consent because of altered level of consciousness. Time Out: Verified patient identification, verified procedure, site/side was marked, verified correct patient position, special equipment/implants available, medications/allergies/relevent history reviewed, required imaging and test results available.   Pre-meds: Etomidate 20 mg IV  Neuromuscular blockade: Rocuronium 50 mg IV  Laryngoscope: Glidescope  Visualization: cords fully visualized  ETT: 7.5 ETT passed on first attempt and secured @ 24 cm at upper incisors  Findings: normal airway   Evaluation:  Tube position confirmed by auscultation and EZCap CXR revealed good position  Pt tolerated procedure well without complications  Initial attempt was with a #4 MAC laryngoscope with poor visualization of cords. The airway was very easily visualized with Glidescope   Merton Border, MD PCCM service Mobile [phone] Pager [phone] 12/25/2017

## 2017-12-25 NOTE — Progress Notes (Signed)
Pharmacy Antibiotic Note  Jack Lowe is a 57 y.o. male admitted on 12/31/2017 with sepsis.  Pharmacy has been consulted for cefepime dosing.  Plan: cefepime 2gm iv q12h  Height: 5\' 10"  (177.8 cm) Weight: 212 lb 15.4 oz (96.6 kg) IBW/kg (Calculated) : 73  Temp (24hrs), Avg:98.6 F (37 C), Min:98.1 F (36.7 C), Max:99.2 F (37.3 C)  Recent Labs  Lab 12/03/2017 0050 12/10/2017 0522 12/21/2017 1803 12/25/17 0500  WBC 22.1*  --   --  19.3*  CREATININE UNABLE TO REPORT DUE TO ICTERUS UNABLE TO REPORT DUE TO ICTERUS FLC UNABLE TO REPORT DUE TO ICTERUS 1.87*  LATICACIDVEN 2.7* 1.3  --   --     Estimated Creatinine Clearance: 52.6 mL/min (A) (by C-G formula based on SCr of 1.87 mg/dL (H)).    Allergies  Allergen Reactions  . Haldol [Haloperidol] Swelling    Tongue swelling  . Toradol [Ketorolac Tromethamine] Swelling    Antimicrobials this admission: Anti-infectives (From admission, onward)   Start     Dose/Rate Route Frequency Ordered Stop   12/25/17 1530  ceFEPIme (MAXIPIME) 2 g in sodium chloride 0.9 % 100 mL IVPB     2 g 200 mL/hr over 30 Minutes Intravenous Every 12 hours 12/25/17 1525     12/25/17 1000  cefTRIAXone (ROCEPHIN) 1 g in sodium chloride 0.9 % 100 mL IVPB  Status:  Discontinued     1 g 200 mL/hr over 30 Minutes Intravenous Daily 12/03/2017 1123 12/25/17 1519   12/10/2017 1600  rifaximin (XIFAXAN) tablet 400 mg     400 mg Per Tube 3 times daily 12/16/2017 1109     12/19/2017 1000  rifaximin (XIFAXAN) tablet 550 mg  Status:  Discontinued     550 mg Per Tube 3 times daily 12/22/2017 0944 12/12/2017 1109   12/19/2017 0915  cefTRIAXone (ROCEPHIN) 2 g in sodium chloride 0.9 % 100 mL IVPB  Status:  Discontinued     2 g 200 mL/hr over 30 Minutes Intravenous Daily 12/23/2017 0905 12/15/2017 1123   12/11/2017 0900  cefTRIAXone (ROCEPHIN) 2 g in sodium chloride 0.9 % 100 mL IVPB  Status:  Discontinued     2 g 200 mL/hr over 30 Minutes Intravenous Every 24 hours 12/25/2017 0855 12/28/2017  0905   12/12/2017 0230  cefTRIAXone (ROCEPHIN) 1 g in sodium chloride 0.9 % 100 mL IVPB     1 g 200 mL/hr over 30 Minutes Intravenous STAT 12/26/2017 0216 12/23/2017 0402      Microbiology results: Recent Results (from the past 240 hour(s))  Blood culture (routine x 2)     Status: None (Preliminary result)   Collection Time: 12/30/2017  2:52 AM  Result Value Ref Range Status   Specimen Description BLOOD LEFT HAND  Final   Special Requests   Final    BOTTLES DRAWN AEROBIC AND ANAEROBIC Blood Culture results may not be optimal due to an excessive volume of blood received in culture bottles   Culture   Final    NO GROWTH 1 DAY Performed at [facility], [address] [place], [place] [id number]    Report Status PENDING  Incomplete  Blood culture (routine x 2)     Status: None (Preliminary result)   Collection Time: 12/05/2017  2:52 AM  Result Value Ref Range Status   Specimen Description BLOOD BLOOD RIGHT WRIST  Final   Special Requests   Final    BOTTLES DRAWN AEROBIC AND ANAEROBIC Blood Culture results may not be optimal  due to an excessive volume of blood received in culture bottles   Culture   Final    NO GROWTH 1 DAY Performed at [facility], [address] [place], [place] [address]    Report Status PENDING  Incomplete  MRSA PCR Screening     Status: None   Collection Time: 01/13/2018 11:27 AM  Result Value Ref Range Status   MRSA by PCR NEGATIVE NEGATIVE Final    Comment:        The GeneXpert MRSA Assay (FDA approved for NASAL specimens only), is one component of a comprehensive MRSA colonization surveillance program. It is not intended to diagnose MRSA infection nor to guide or monitor treatment for MRSA infections. Performed at [facility], [address] [place], [place] [address]      Thank you for allowing pharmacy to be a part of this patient's care.  Gerre Pebbles Kirtan Sada 12/25/2017 3:25 PM

## 2017-12-25 NOTE — Consult Note (Signed)
PULMONARY / CRITICAL CARE MEDICINE   Name: Jack Lowe MRN: [id number] DOB: [birth year]    ADMISSION DATE:  12/14/2017 CONSULTATION DATE:  04/23  PT PROFILE:   80 M with history of alcohol abuse, alcoholic hepatitis, delirium tremens, schizophrenia brought to ED 04/23 by family for AMS, profound jaundice.  Found to be profoundly hyponatremic (Na 106).  Admitted to ICU/SDU.  Suffered apneic respiratory arrest in radiology prior to abdominal ultrasound.  MAJOR EVENTS/TEST RESULTS: 04/23 admission as documented above 04/23 suffered respiratory arrest in radiology.  Intubated 04/23 CT head: No acute findings 04/23 CTAP: Fatty liver.  Otherwise, no acute findings 04/23 Abd [place]: The study is limited due to shadowing bowel gas. No cause for sepsis identified. Hepatic steatosis  04/23 Hypotension > norepinephrine initiated 04/24 Requiring high dose norepi (36 mcg/min). Vasopressin initiated. Remains on hypertonic saline. Na gradually improving. Requiring sedation. High Ve prohibits weaning. Vent changes made. Oliguric (Uo 280cc/24 hrs). Propofol changed to dex gtt + intermittent lorazepam + intermittent fentanyl  INDWELLING DEVICES:: ETT 04/23 >>  RUE PICC 04/23 >>   MICRO DATA: MRSA PCR 04/23 >> NEG  Blood 04/23 >>  Resp 04/24 >>   ANTIMICROBIALS:  Ceftriaxone 04/23 >>     SUBJECTIVE:  Requiring high dose norepi (36 mcg/min). Vasopressin initiated. Remains on hypertonic saline. Na gradually improving. Requiring sedation. High Ve prohibits weaning. Vent changes made. Oliguric (Uo 280cc/24 hrs). Propofol changed to dex gtt + intermittent lorazepam + intermittent fentanyl  VITAL SIGNS: BP (!) 107/40   Pulse (!) 112   Temp 99.2 F (37.3 C) (Oral)   Resp (!) 27   Ht _0  (1.778 m)   Wt 212 lb 15.4 oz (96.6 kg)   SpO2 96%   BMI 30.56 kg/m   HEMODYNAMICS:    VENTILATOR SETTINGS: Vent Mode: PRVC FiO2 (%):  [40 %] 40 % Set Rate:  [20 bmp] 20 bmp Vt Set:  [500 mL] 500  mL PEEP:  [5 cmH20] 5 cmH20  INTAKE / OUTPUT: I/O last 3 completed shifts: In: 2689 [I.V.:1289; IV Piggyback:1400] Out: 25 [Urine:280]  PHYSICAL EXAMINATION: General: Severe jaundice, intubated, sedated on propofol, not F/C Neuro: Cranial nerves intact, moves all extremities HEENT: NCAT, severe sclericterus Cardiovascular: Regular, no M Lungs: Slightly coarse BS Abdomen: Distended, mild tympani, diminished bowel sounds, NT, + Bs Ext: Warm, no edema Skin: Severe jaundice, otherwise no lesions  LABS:  BMET Recent Labs  Lab 12/11/2017 0522  12/17/2017 1803 12/22/2017 2230 12/25/17 0500  NA 109*   < > 112* 113* 117*  K 3.6  --  3.3*  --  2.9*  CL 79*  --  81*  --  89*  CO2 17*  --  18*  --  18*  BUN 18  --  20  --  22*  CREATININE UNABLE TO REPORT DUE TO ICTERUS FLC  --  UNABLE TO REPORT DUE TO ICTERUS  --  1.87*  GLUCOSE 83  --  99  --  112*   < > = values in this interval not displayed.    Electrolytes Recent Labs  Lab 12/14/2017 0050 12/08/2017 0522 12/15/2017 1803 12/25/17 0500  CALCIUM 7.4* 7.0* 7.1* 6.7*  MG 1.4*  --   --   --   PHOS UNABLE TO REPORT DUE TO ICTERUS  --   --   --     CBC Recent Labs  Lab 12/04/2017 0050 12/25/17 0500  WBC 22.1* 19.3*  HGB 9.0* 8.6*  HCT 26.4* 25.5*  PLT 171 197    Coag's Recent Labs  Lab 12/19/2017 0050  APTT 43*  INR 1.12    Sepsis Markers Recent Labs  Lab 12/31/2017 0050 12/25/2017 0522  LATICACIDVEN 2.7* 1.3    ABG Recent Labs  Lab 12/11/2017 1118  PHART 7.18*  PCO2ART 51*  PO2ART 70*    Liver Enzymes Recent Labs  Lab 12/25/2017 0050 12/25/17 0500  AST 174* 207*  ALT 78* 69*  ALKPHOS 810* 705*  BILITOT 27.5* 24.4*  ALBUMIN 1.9* 1.7*    Cardiac Enzymes Recent Labs  Lab 12/07/2017 0050  TROPONINI 0.10*    Glucose Recent Labs  Lab 12/16/2017 0419 12/21/2017 1927 12/09/2017 2317 12/25/17 0325 12/25/17 0756  GLUCAP 97 103* 115* 115* 104*    CXR: RLL ATX vs infiltrate    ASSESSMENT /  PLAN:  NEUROLOGIC A:   Chronic alcohol abuse with hx of DTs Acute encephalopathy due to hyponatremia Hyperammonemia /hepatic encephalopathy ICU/ventilator associated discomfort P:   RASS goal: 0, -1 Change propofol to dex Cont intermittent fentanyl and lorazepam Cont lactulose and rifaximin ordered Cont hypertonic saline protocol until Na 125 Cont igh dose thiamine  PULMONARY A: Acute ventilator dependent respiratory failure after respiratory arrest Smoker with documented history of COPD RLL atx vs INF P:   Cont full vent support - settings reviewed and/or adjusted Cont vent bundle Daily SBT if/when meets criteria  Cont nebs Resp culture  CARDIOVASCULAR A:  Shock - unclear etiology P:  MAP goal >70 mmHg Cont norepinephrine  Vasopressin initiated 04/24  RENAL A:   Profound hyponatremia Mild metabolic acidosis Oliguria  AKI Hypokalemia P:   Cont hypertonic saline protocol initiated 04/23 Monitor BMET intermittently Monitor I/Os Correct electrolytes as indicated  K+ repleted 04/24  GASTROINTESTINAL A:   Acute hepatic failure Alcoholic hepatitis P:   SUP: IV PPI Pentoxifylline initiated 04/23 by GI service for possible hepatorenal syndrome Holding TFs for now  HEMATOLOGIC A:   Macrocytic anemia P:  DVT px: SCDs Monitor CBC intermittently Transfuse per usual guidelines  Cont Folate   INFECTIOUS A:   Leukocytosis with no definite infections identified P:   Monitor temp, WBC count Micro and abx as above  Check PCT  ENDOCRINE A:   Risk of hypoglycemia P:   Monitor CBGs   FAMILY: Girlfriend updated yesterday  CCM time: 40 mins The above time includes time spent in consultation with patient and/or family members and reviewing care plan on multidisciplinary rounds  Merton Border, MD PCCM service Mobile [phone] Pager [phone] 12/25/2017, 11:27 AM

## 2017-12-25 NOTE — Consult Note (Addendum)
PULMONARY / CRITICAL CARE MEDICINE   Name: Jack Lowe MRN: [id number] DOB: 14-Sep-1960    ADMISSION DATE:  2017/11/24 CONSULTATION DATE:  04/23  PT PROFILE:   [age] M with history of alcohol abuse, alcoholic hepatitis, delirium tremens, schizophrenia brought to ED 04/23 by family for AMS, profound jaundice.  Found to be profoundly hyponatremic (Na 106).  Admitted to ICU/SDU.  Suffered apneic respiratory arrest in radiology prior to abdominal ultrasound.  MAJOR EVENTS/TEST RESULTS: 04/23 admission as documented above 04/23 suffered respiratory arrest in radiology.  Intubated 04/23 CT head: No acute findings 04/23 CTAP: Fatty liver.  Otherwise, no acute findings 04/23 Abd [place]: The study is limited due to shadowing bowel gas. No cause for sepsis identified. Hepatic steatosis  04/23 gastroenterology consultation 04/23 Hypotension > norepinephrine initiated 04/24 Requiring high dose norepi (36 mcg/min). Vasopressin initiated. Remains on hypertonic saline. Na gradually improving. Requiring sedation. High Ve prohibits weaning. Vent changes made. Oliguric (Uo 280cc/24 hrs). Propofol changed to dex gtt + intermittent lorazepam + intermittent fentanyl 04/24 nephrology consultation requested  INDWELLING DEVICES:: ETT 04/23 >>  RUE PICC 04/23 >>   MICRO DATA: MRSA PCR 04/23 >> NEG  Blood 04/23 >>   ANTIMICROBIALS:  Ceftriaxone 04/23 >> 04/24 Vanc 04/24 >>  Cefepime 04/24 >>     SUBJECTIVE:  Requiring high dose norepi (36 mcg/min). Vasopressin initiated. Remains on hypertonic saline. Na gradually improving. Requiring sedation. High Ve prohibits weaning. Vent changes made. Oliguric (Uo 280cc/24 hrs). Propofol changed to dex gtt + intermittent lorazepam + intermittent fentanyl  VITAL SIGNS: BP (!) 107/40   Pulse (!) 112   Temp 99.2 F (37.3 C) (Oral)   Resp (!) 27   Ht 5\' 10"  (1.778 m)   Wt 212 lb 15.4 oz (96.6 kg)   SpO2 96%   BMI 30.56 kg/m   HEMODYNAMICS:    VENTILATOR  SETTINGS: Vent Mode: PRVC FiO2 (%):  [40 %] 40 % Set Rate:  [20 bmp] 20 bmp Vt Set:  [500 mL] 500 mL PEEP:  [5 cmH20] 5 cmH20  INTAKE / OUTPUT: I/O last 3 completed shifts: In: 2689 [I.V.:1289; IV Piggyback:1400] Out: 280 [Urine:280]  PHYSICAL EXAMINATION: General: Severe jaundice, intubated, sedated on propofol, not F/C Neuro: Minimally responsive, cranial nerves intact, moves all extremities HEENT: NCAT, severe sclericterus Cardiovascular: Regular, no M Lungs: Slightly coarse BS Abdomen: Distended, mild tympani, diminished bowel sounds, NT, + BS Ext: Warm, no edema Skin: Severe jaundice, otherwise no lesions  LABS:  BMET Recent Labs  Lab May 31, 2018 0522  May 31, 2018 1803 May 31, 2018 2230 12/25/17 0500  NA 109*   < > 112* 113* 117*  K 3.6  --  3.3*  --  2.9*  CL 79*  --  81*  --  89*  CO2 17*  --  18*  --  18*  BUN 18  --  20  --  22*  CREATININE UNABLE TO REPORT DUE TO ICTERUS FLC  --  UNABLE TO REPORT DUE TO ICTERUS  --  1.87*  GLUCOSE 83  --  99  --  112*   < > = values in this interval not displayed.    Electrolytes Recent Labs  Lab May 31, 2018 0050 May 31, 2018 0522 May 31, 2018 1803 12/25/17 0500  CALCIUM 7.4* 7.0* 7.1* 6.7*  MG 1.4*  --   --   --   PHOS UNABLE TO REPORT DUE TO ICTERUS  --   --   --     CBC Recent Labs  Lab May 31, 2018 0050 12/25/17 0500  WBC 22.1* 19.3*  HGB 9.0* 8.6*  HCT 26.4* 25.5*  PLT 171 197    Coag's Recent Labs  Lab 12/16/2017 0050  APTT 43*  INR 1.12    Sepsis Markers Recent Labs  Lab 12/08/2017 0050 12/05/2017 0522  LATICACIDVEN 2.7* 1.3    ABG Recent Labs  Lab 12/20/2017 1118  PHART 7.18*  PCO2ART 51*  PO2ART 70*    Liver Enzymes Recent Labs  Lab 12/23/2017 0050 12/25/17 0500  AST 174* 207*  ALT 78* 69*  ALKPHOS 810* 705*  BILITOT 27.5* 24.4*  ALBUMIN 1.9* 1.7*    Cardiac Enzymes Recent Labs  Lab 12/31/2017 0050  TROPONINI 0.10*    Glucose Recent Labs  Lab 12/26/2017 0419 12/13/2017 1927 12/02/2017 2317  12/25/17 0325 12/25/17 0756  GLUCAP 97 103* 115* 115* 104*    CXR: RLL atelectasis versus infiltrate    ASSESSMENT / PLAN:  NEUROLOGIC A:   Chronic alcohol abuse History of DTs Acute encephalopathy due to hyponatremia Hyperammonemia /hepatic encephalopathy ICU/ventilator associated discomfort P:   RASS goal: -1, -2 Change propofol to dexmedetomidine Continue intermittent lorazepam and fentanyl Continue lactulose and rifaximin initiated 04/23 Continue hypertonic saline protocol initiated 04/23 Continue high dose thiamine  PULMONARY A: Acute VDRF due to respiratory arrest 4/23 Smoker with documented history of COPD, no evidence of bronchospasm RLL atx vs PNA P:   Cont full vent support - settings reviewed and/or adjusted Cont vent bundle Daily SBT if/when meets criteria Continue nebulized bronchodilators as needed Respiratory culture ordered  CARDIOVASCULAR A:  Hypotension after intubation, likely due to sedation Shock with increasing vasopressor requirements P:  MAP goal >70 mmHg Norepinephrine initiated 04/23 Vasopressin initiated 0 4/24 Check PCT for possible septic shock Initiate hydrocortisone 04/24  RENAL A:   Profound hyponatremia Mild metabolic acidosis AKI, oliguric Hypokalemia P:   Continue hypertonic saline protocol initiated 04/23 Monitor BMET intermittently Monitor I/Os Correct electrolytes as indicated  Potassium repletion ordered 04/24 Nephrology consultation requested 04/24  GASTROINTESTINAL A:   Acute hepatic failure Alcoholic hepatitis P:   SUP: IV pantoprazole Holding TF's for now Pentoxifylline initiated 04/23 by gastroenterology  HEMATOLOGIC A:   Macrocytic anemia P:  DVT px: SCDs Monitor CBC intermittently Transfuse per usual guidelines  Continue IV folate  INFECTIOUS A:   Leukocytosis Concern for severe sepsis P:   Monitor temp, WBC count Micro and abx as above  Check PCT Antibiotics broadened  04/24  ENDOCRINE A:   No acute issues P:   Monitor CBGs Consider SSI for glucose >180   FAMILY: 2 daughters and girlfriend updated at bedside.  I conveyed to them that his prognosis for survival is very poor.  I recommended that we continue our aggressive support but not perform ACLS if he fails all of these efforts.  Further, I recommended that we reevaluate his progress in the early part of next week.  If he is not making discernible progress by that time, we should consider an alternative course of discontinuation of life-sustaining therapies  CCM time: 45 mins The above time includes time spent in consultation with patient and/or family members and reviewing care plan on multidisciplinary rounds  Billy Fischer, MD PCCM service Mobile [phone] Pager [phone]    12/25/2017, 11:14 AM

## 2017-12-25 NOTE — Progress Notes (Signed)
Initial Nutrition Assessment  DOCUMENTATION CODES:   Not applicable  INTERVENTION:  Patient is not stable enough for enteral nutrition at this time.  Once patient is more hemodynamically stable recommend initiating Vital 1.5 Cal at 50 mL/hr (1200 mL goal daily volume) + Pro-Stat 30 mL TID. Provides 2100 kcal, 126 grams of protein, 912 mL H2O daily.  Recommend changing tablet MVI to liquid form as tablet is hard to crush and provide per tube.  Agree with high-dose IV thiamine supplementation in setting of history of EtOH abuse to help prevent Wernicke's encephalopathy. In patients with a hx of EtOH abuse there is often limited ability to absorb enteral forms of thiamine.  Even though patient's folate level was normal recommend providing folic acid 1 mg daily as he has increased needs.  NUTRITION DIAGNOSIS:   Inadequate oral intake related to inability to eat as evidenced by NPO status.  GOAL:   Provide needs based on ASPEN/SCCM guidelines  MONITOR:   Vent status, Labs, Weight trends, TF tolerance, I & O's  REASON FOR ASSESSMENT:   Ventilator    ASSESSMENT:   57 year old male with PMHx of schizophrenia, EtOH abuse, COPD, HTN admitted with AMS, profound jaundice, and profound hyponatremia (Na 106), subsequently suffered apneic respiratory arrest in radiology before abdominal ultrasound requiring emergent intubation on 4/23, also with hyperammonemia, hepatic encephalopathy.   Patient intubated and sedated. No family members present at time of RD assessment. Per weight history in chart patient was 192.9 lbs on 01/10/2016 and 185.8 lbs on 05/28/2017. Will use weight of 94.8 kg on 4/23 to estimate needs.  Access: 18 Fr. OGT placed 4/23; terminates in proximal stomach per abdominal x-ray 4/23; 65 cm at corner of mouth  MAP: 58-61 mmHg  Patient is currently intubated on ventilator support Ve: 11.7 L/min Temp (24hrs), Avg:98.5 F (36.9 C), Min:98.1 F (36.7 C), Max:99.2 F (37.3  C)  Propofol: N/A  Medications reviewed and include: lactulose 30 grams TID per tube, MVI daily per tube, pantoprazole, ceftriaxone, Precedex gtt, norepinephrine gtt at 37 mcg/min (dose has been increasing this AM), potassium chloride 10 mEq IV 6 times today, hypertonic saline (3%) at 50 mL/hr, thiamine 500 mg IV daily, vasopressin gtt at 0.03 units/min.  Labs reviewed: CBG 103-115, Sodium 117 (trending up), Potassium 2.9, Chloride 89, CO2 18, BUN 22, Creatinine 1.87, Calcium 6.7 (corrects to 8.54 with albumin of 1.7), Hgb 8.6, Hct 25.5, MCV 110.9. On 4/23 Iron 58, TIBC 167, Folate 11.2, Vitamin B12 4831.  I/O: 280 mL UOP yesterday (0.1 mL/kg/hr)  Weight trend: pt was 94.8 kg on admission and is up to 96.6 kg today  Patient does not meet criteria for malnutrition at this time.  Discussed with RN.  NUTRITION - FOCUSED PHYSICAL EXAM:    Most Recent Value  Orbital Region  No depletion  Upper Arm Region  No depletion  Thoracic and Lumbar Region  No depletion  Buccal Region  Unable to assess  Temple Region  No depletion  Clavicle Bone Region  No depletion  Clavicle and Acromion Bone Region  No depletion  Scapular Bone Region  Unable to assess  Dorsal Hand  No depletion  Patellar Region  No depletion  Anterior Thigh Region  No depletion  Posterior Calf Region  No depletion  Edema (RD Assessment)  None  Hair  Reviewed  Eyes  Unable to assess  Mouth  Unable to assess  Skin  Reviewed [jaundiced]  Nails  Reviewed     Diet Order:  Seizure precautions  EDUCATION NEEDS:   No education needs have been identified at this time  Skin:  Skin Assessment: Reviewed RN Assessment  Last BM:  PTA (12/23/2017 per chart)  Height:   Ht Readings from Last 1 Encounters:  12/31/2017 5\' 10"  (1.778 m)    Weight:   Wt Readings from Last 1 Encounters:  12/25/17 212 lb 15.4 oz (96.6 kg)    Ideal Body Weight:  75.5 kg  BMI:  Body mass index is 30.56 kg/m.  Estimated Nutritional Needs:    Kcal:  2106 (PSU 2003b w/ MSJ 1798, Ve 11.7, Tmax 37.3)  Protein:  114-142 grams (1.2-1.5 grams/kg ABW; 1.5-1.9 grams/kg IBW)  Fluid:  1.5-1.8 L/day  Helane RimaLeanne Garima Chronis, MS, RD, LDN Office: [phone] Pager: [phone] After Hours/Weekend Pager: [phone]

## 2017-12-25 NOTE — Progress Notes (Signed)
Jonathon Bellows , MD [address], [place], [place], [place], [address] [address], [place], [place], [place], [address] Phone: [phone]  Fax: [phone]   SARGENT MANKEY is being followed for Alcoholic hepatitis  Day 1 of follow up   Subjective: Unresponsive    Objective: Vital signs in last 24 hours: Vitals:   12/25/17 0400 12/25/17 0500 12/25/17 0600 12/25/17 0630  BP: (!) 98/45 (!) 99/44 (!) 98/41 (!) 107/40  Pulse: 95 91 96 (!) 112  Resp: (!) 23 (!) 22 (!) 23 (!) 27  Temp: 99.2 F (37.3 C)     TempSrc: Oral     SpO2: 94% 95% 95% 96%  Weight:  212 lb 15.4 oz (96.6 kg)    Height:       Weight change: -1 lb 0.6 oz (-0.47 kg)  Intake/Output Summary (Last 24 hours) at 12/25/2017 1134 Last data filed at 12/25/2017 1057 Gross per 24 hour  Intake 1831.98 ml  Output 280 ml  Net 1551.98 ml     Exam: Heart:: Regular rate and rhythm, S1S2 present or without murmur or extra heart sounds Lungs: rales b/l  Abdomen: soft, nontender, normal bowel sounds   Lab Results: _0 @ Micro Results: Recent Results (from the past 240 hour(s))  Blood culture (routine x 2)     Status: None (Preliminary result)   Collection Time: 12/23/2017  2:52 AM  Result Value Ref Range Status   Specimen Description BLOOD LEFT HAND  Final   Special Requests   Final    BOTTLES DRAWN AEROBIC AND ANAEROBIC Blood Culture results may not be optimal due to an excessive volume of blood received in culture bottles   Culture   Final    NO GROWTH 1 DAY Performed at [facility], [address], [place], [place] [address]    Report Status PENDING  Incomplete  Blood culture (routine x 2)     Status: None (Preliminary result)   Collection Time: 12/10/2017  2:52 AM  Result Value Ref Range Status   Specimen Description BLOOD BLOOD RIGHT WRIST  Final   Special Requests   Final    BOTTLES DRAWN AEROBIC AND ANAEROBIC Blood Culture results may not be optimal due to an excessive volume of  blood received in culture bottles   Culture   Final    NO GROWTH 1 DAY Performed at [facility], [address], [place], [place] [address]    Report Status PENDING  Incomplete  MRSA PCR Screening     Status: None   Collection Time: 12/12/2017 11:27 AM  Result Value Ref Range Status   MRSA by PCR NEGATIVE NEGATIVE Final    Comment:        The GeneXpert MRSA Assay (FDA approved for NASAL specimens only), is one component of a comprehensive MRSA colonization surveillance program. It is not intended to diagnose MRSA infection nor to guide or monitor treatment for MRSA infections. Performed at [facility], [address], [place], [place] [address]    Studies/Results: Ct Abdomen Pelvis Wo Contrast  Result Date: 12/23/2017 CLINICAL DATA:  Abdominal distension. EXAM: CT ABDOMEN AND PELVIS WITHOUT CONTRAST TECHNIQUE: Multidetector CT imaging of the abdomen and pelvis was performed following the standard protocol without IV contrast. COMPARISON:  None. FINDINGS: Lower chest: Mild bilateral posterior basilar subsegmental atelectasis is noted. Hepatobiliary: No gallstones are noted. Fatty infiltration of the liver is noted. No biliary dilatation is noted. Pancreas: Unremarkable. No pancreatic ductal dilatation or surrounding inflammatory  changes. Spleen: Normal in size without focal abnormality. Adrenals/Urinary Tract: Adrenal glands are unremarkable. Kidneys are normal, without renal calculi, focal lesion, or hydronephrosis. Bladder is unremarkable. Stomach/Bowel: Stomach is within normal limits. Appendix appears normal. No evidence of bowel wall thickening, distention, or inflammatory changes. Vascular/Lymphatic: Aortic atherosclerosis. No enlarged abdominal or pelvic lymph nodes. Reproductive: Prostate is unremarkable. Other: No abnormal fluid collection is noted. Small fat containing right inguinal hernia is noted. Musculoskeletal: No acute or significant osseous findings.  IMPRESSION: Fatty infiltration of the liver. No other significant abnormality seen in the abdomen or pelvis. Electronically Signed   By: Marijo Conception, M.D.   On: 12/25/2017 10:26   Dg Abd 1 View  Result Date: 12/31/2017 CLINICAL DATA:  Nasogastric tube placement. EXAM: ABDOMEN - 1 VIEW COMPARISON:  Radiographs of September 23, 2017. FINDINGS: The bowel gas pattern is normal. Distal tip of nasogastric tube is seen in proximal stomach. No radio-opaque calculi or other significant radiographic abnormality are seen. IMPRESSION: Distal tip of nasogastric tube seen in proximal stomach. No definite evidence of bowel obstruction. Electronically Signed   By: Marijo Conception, M.D.   On: 12/07/2017 12:50   Ct Head Wo Contrast  Result Date: 12/28/2017 CLINICAL DATA:  Status post cardiac arrest with altered mental status EXAM: CT HEAD WITHOUT CONTRAST TECHNIQUE: Contiguous axial images were obtained from the base of the skull through the vertex without intravenous contrast. COMPARISON:  None. FINDINGS: Brain: The ventricles are normal in size and configuration. There is no intracranial mass, hemorrhage, extra-axial fluid collection, or midline shift. The gray-white compartments appear normal. No evident acute infarct. Vascular: No hyperdense vessel. No appreciable vascular calcification evident. Skull: The bony calvarium appears intact. Sinuses/Orbits: There is patchy opacity in several ethmoid air cells. There is slight mucosal thickening in the anterior sphenoid sinus regions. Other visualized paranasal sinuses are clear. Frontal sinuses are somewhat hypoplastic. Orbits appear symmetric bilaterally. Other: Mastoid air cells are clear. IMPRESSION: No mass or hemorrhage. Gray-white compartments appear normal. Mild paranasal sinus disease noted. Electronically Signed   By: Lowella Grip III M.D.   On: 12/06/2017 10:16   US Abdomen Complete  Result Date: 12/19/2017 CLINICAL DATA:  Sepsis EXAM: ABDOMEN ULTRASOUND  COMPLETE COMPARISON:  None. FINDINGS: Gallbladder: No gallstones or wall thickening visualized. No sonographic Murphy sign noted by sonographer. Common bile duct: Diameter: 3.3 mm Liver: Hepatic steatosis. No focal mass. Portal vein is patent on color Doppler imaging with normal direction of blood flow towards the liver. IVC: No abnormality visualized. Pancreas: The pancreas was not visualized. Spleen: Size and appearance within normal limits. Right Kidney: Length: 14.1 cm. Echogenicity within normal limits. No mass or hydronephrosis visualized. Left Kidney: Length: 13.6 cm. Echogenicity within normal limits. No mass or hydronephrosis visualized. Abdominal aorta: Not well visualized. Other findings: None. IMPRESSION: The study is limited due to shadowing bowel gas. No cause for sepsis identified. Hepatic steatosis. Electronically Signed   By: Dorise Bullion III M.D   On: 12/23/2017 12:51   Dg Chest Port 1 View  Result Date: 12/25/2017 CLINICAL DATA:  COPD, hepatic failure and alcohol withdrawal. Current smoker. Intubated patient. EXAM: PORTABLE CHEST 1 VIEW COMPARISON:  Chest x-ray of December 24, 2017 FINDINGS: The left lung is well-expanded. There is minimal atelectasis or scarring at the left base. On the right there is mild volume loss with persistent increased density largely obscuring the hemidiaphragm. There is a trace of pleural fluid on the right. There is no pneumothorax. The endotracheal tube  tip projects 2.9 cm above the carina. The esophagogastric tube tip and proximal port project below the GE junction. The right-sided PICC line tip projects over the distal third of the SVC. IMPRESSION: Persistent increased density at the right lung base may reflect atelectasis or pneumonia. Probable trace right pleural effusion. The support tubes are in reasonable position. Electronically Signed   By: David  Martinique M.D.   On: 12/25/2017 10:23   Dg Chest Port 1 View  Result Date: 12/31/2017 CLINICAL DATA:  57 y/o   M; PICC line placement. EXAM: PORTABLE CHEST 1 VIEW COMPARISON:  12/07/2017 chest radiograph. FINDINGS: Endotracheal tube 1.7 cm above the carina. Enteric tube tip in gastric body. Right PICC line tip projects over right atrium mild reticular opacities of the lungs are stable. No focal consolidation. No pleural effusion or pneumothorax identified. Bones are unremarkable. IMPRESSION: Right PICC line tip projects over right atrium. Stable mild reticular opacities of lungs, possibly vascular congestion. No consolidation. Electronically Signed   By: Kristine Garbe M.D.   On: 12/03/2017 18:03   Dg Chest Port 1 View  Result Date: 12/29/2017 CLINICAL DATA:  Status post intubation. EXAM: PORTABLE CHEST 1 VIEW COMPARISON:  Radiographs of September 23, 2017. FINDINGS: Stable cardiomediastinal silhouette. Endotracheal tube is seen projected over tracheal air shadow with distal tip 5 cm above the carina. No pneumothorax or pleural effusion is noted. No acute pulmonary disease is noted. Bony thorax is unremarkable. IMPRESSION: Endotracheal tube in grossly good position. No acute cardiopulmonary abnormality seen. Electronically Signed   By: Marijo Conception, M.D.   On: 12/23/2017 10:05   [place] Ekg Site Rite  Result Date: 12/28/2017 If Site Rite image not attached, placement could not be confirmed due to current cardiac rhythm.  [place] Ekg Site Rite  Result Date: 12/31/2017 If Site Rite image not attached, placement could not be confirmed due to current cardiac rhythm.  Medications: I have reviewed the patient's current medications. Scheduled Meds: . chlorhexidine gluconate (MEDLINE KIT)  15 mL Mouth Rinse BID  . lactulose  30 g Per Tube TID  . mouth rinse  15 mL Mouth Rinse QID  . multivitamin with minerals  1 tablet Per Tube Daily  . [START ON 12/28/2017] pantoprazole  40 mg Intravenous Q12H  . pentoxifylline  400 mg Oral Q breakfast  . rifaximin  400 mg Per Tube TID   Continuous Infusions: . cefTRIAXone  (ROCEPHIN)  IV 1 g (12/25/17 1040)  . dexmedetomidine (PRECEDEX) IV infusion 1.2 mcg/kg/hr (12/25/17 1117)  . norepinephrine (LEVOPHED) Adult infusion 40 mcg/min (12/25/17 0942)  . potassium chloride 10 mEq (12/25/17 1116)  . sodium chloride (hypertonic) 50 mL/hr at 12/25/17 0600  . thiamine injection 0 mg (12/26/2017 1507)  . vasopressin (PITRESSIN) infusion - *FOR SHOCK* 0.03 Units/min (12/25/17 0908)   PRN Meds:.fentaNYL (SUBLIMAZE) injection, ipratropium-albuterol, LORazepam, sodium chloride flush   Assessment: Active Problems:   Liver failure (HCC)  EMILIO BAYLOCK is a 57 y.o. y/o male with severely low electrolytes (NA,Phos,Mg, Cl)- likely pre renal. H/o alcohol abuse with normal INR suggesting he does not have acute liver failure. He has an elevated WCC , elevated transaminases and T bilirubin very likely from alcoholic hepatitis. Also has acute renal failure. He also has a history of long standing use of NSAIDs and his low hemoglobin may be related to blood loss from NSAID use.  At this point of time there is no coffee-ground material coming from his NG tube rather it is yellow and bilious in  nature indicating no active upper GI bleed.  His metabolic disturbances may be due to a combination of the effects of alcohol, AKI, NSAID usage, dehydration from diarrhea.Hb stable , T bilirubin coming down, has low potassium , sodium better.    Plan   1. F/u acute hepatitis A/B/C/HSV/CMV,EBV,VZV 2.  Watch for withdrawal 4. IF mental status not improving suggest CT head to rule out sub dural bleed which is not uncommon in alcoholics 5. IV thiamine to prevent wernickes encephelopathy 6. Continue Trental 400 mg once a day for acute alcoholic hepatitis with AKI, watch for hepatorenal syndrome and if develops will need midodrine, octreotide and albumin infusion  7. IV PPI .  8. Doppler veins of the liver to rule out portal vein thrombosis  9. Overall very high mortality rate associated with his  conditions.    LOS: 1 day   Jonathon Bellows, MD 12/25/2017, 11:34 AM

## 2017-12-25 NOTE — Progress Notes (Signed)
[facility] [place] [facility]   PATIENT NAME: Jack Lowe    MR#:  [id number]  DATE OF BIRTH:  05-Aug-1961  SUBJECTIVE:   Patient intubated and sedated on ventilator this morning.  Patient had apneic respiratory arrest in radiology prior to abdominal ultrasound Prior to this he was given 2 mg of Ativan.  REVIEW OF SYSTEMS:    Unable to obtain patient intubated and sedated on vent   Tolerating Diet: npo      DRUG ALLERGIES:   Allergies  Allergen Reactions  . Haldol [Haloperidol] Swelling    Tongue swelling  . Toradol [Ketorolac Tromethamine] Swelling    VITALS:  Blood pressure (!) 107/40, pulse (!) 112, temperature 99.2 F (37.3 C), temperature source Oral, resp. rate (!) 27, height 5\' 10"  (1.778 m), weight 96.6 kg (212 lb 15.4 oz), SpO2 96 %.  PHYSICAL EXAMINATION:  Constitutional: Appears well-developed and well-nourished. No distress. HENT: Normocephalic. Marland Kitchen Patient intubated Eyes: Conjunctivae  are normal. PERRLA, positive scleral icterus.  Neck: Neck supple. No JVD. No tracheal deviation. CVS: RRR, S1/S2 +, no murmurs, no gallops, no carotid bruit.  Pulmonary: Effort and breath sounds normal, no stridor, rhonchi, wheezes, rales.  Abdominal: Distended abdomen with diminished bowel sounds no fluid wave.  Musculoskeletal: Normal range of motion. No edema and no tenderness.  Neuro:sedated on vent Skin: Skin is warm and dry.  Jaundice Psychiatric: Sedated on vent     LABORATORY PANEL:   CBC Recent Labs  Lab 12/25/17 0500  WBC 19.3*  HGB 8.6*  HCT 25.5*  PLT 197   ------------------------------------------------------------------------------------------------------------------  Chemistries  Recent Labs  Lab 12/30/17 0050  12/25/17 0500  NA 106*   < > 117*  K 3.6   < > 2.9*  CL 72*   < > 89*  CO2 16*   < > 18*  GLUCOSE 77   < > 112*  BUN 17   < > 22*  CREATININE UNABLE TO REPORT DUE TO ICTERUS   < > 1.87*  CALCIUM  7.4*   < > 6.7*  MG 1.4*  --   --   AST 174*  --  207*  ALT 78*  --  69*  ALKPHOS 810*  --  705*  BILITOT 27.5*  --  24.4*   < > = values in this interval not displayed.   ------------------------------------------------------------------------------------------------------------------  Cardiac Enzymes Recent Labs  Lab 2017/12/30 0050  TROPONINI 0.10*   ------------------------------------------------------------------------------------------------------------------  RADIOLOGY:  Ct Abdomen Pelvis Wo Contrast  Result Date: 2017/12/30 CLINICAL DATA:  Abdominal distension. EXAM: CT ABDOMEN AND PELVIS WITHOUT CONTRAST TECHNIQUE: Multidetector CT imaging of the abdomen and pelvis was performed following the standard protocol without IV contrast. COMPARISON:  None. FINDINGS: Lower chest: Mild bilateral posterior basilar subsegmental atelectasis is noted. Hepatobiliary: No gallstones are noted. Fatty infiltration of the liver is noted. No biliary dilatation is noted. Pancreas: Unremarkable. No pancreatic ductal dilatation or surrounding inflammatory changes. Spleen: Normal in size without focal abnormality. Adrenals/Urinary Tract: Adrenal glands are unremarkable. Kidneys are normal, without renal calculi, focal lesion, or hydronephrosis. Bladder is unremarkable. Stomach/Bowel: Stomach is within normal limits. Appendix appears normal. No evidence of bowel wall thickening, distention, or inflammatory changes. Vascular/Lymphatic: Aortic atherosclerosis. No enlarged abdominal or pelvic lymph nodes. Reproductive: Prostate is unremarkable. Other: No abnormal fluid collection is noted. Small fat containing right inguinal hernia is noted. Musculoskeletal: No acute or significant osseous findings. IMPRESSION: Fatty infiltration of the liver. No other significant abnormality seen in the abdomen  or pelvis. Electronically Signed   By: Lupita Raider, M.D.   On: 12/22/2017 10:26   Dg Abd 1 View  Result Date:  12/23/2017 CLINICAL DATA:  Nasogastric tube placement. EXAM: ABDOMEN - 1 VIEW COMPARISON:  Radiographs of September 23, 2017. FINDINGS: The bowel gas pattern is normal. Distal tip of nasogastric tube is seen in proximal stomach. No radio-opaque calculi or other significant radiographic abnormality are seen. IMPRESSION: Distal tip of nasogastric tube seen in proximal stomach. No definite evidence of bowel obstruction. Electronically Signed   By: Lupita Raider, M.D.   On: 12/20/2017 12:50   Ct Head Wo Contrast  Result Date: 12/14/2017 CLINICAL DATA:  Status post cardiac arrest with altered mental status EXAM: CT HEAD WITHOUT CONTRAST TECHNIQUE: Contiguous axial images were obtained from the base of the skull through the vertex without intravenous contrast. COMPARISON:  None. FINDINGS: Brain: The ventricles are normal in size and configuration. There is no intracranial mass, hemorrhage, extra-axial fluid collection, or midline shift. The gray-white compartments appear normal. No evident acute infarct. Vascular: No hyperdense vessel. No appreciable vascular calcification evident. Skull: The bony calvarium appears intact. Sinuses/Orbits: There is patchy opacity in several ethmoid air cells. There is slight mucosal thickening in the anterior sphenoid sinus regions. Other visualized paranasal sinuses are clear. Frontal sinuses are somewhat hypoplastic. Orbits appear symmetric bilaterally. Other: Mastoid air cells are clear. IMPRESSION: No mass or hemorrhage. Gray-white compartments appear normal. Mild paranasal sinus disease noted. Electronically Signed   By: Bretta Bang III M.D.   On: 12/04/2017 10:16   US Abdomen Complete  Result Date: 12/08/2017 CLINICAL DATA:  Sepsis EXAM: ABDOMEN ULTRASOUND COMPLETE COMPARISON:  None. FINDINGS: Gallbladder: No gallstones or wall thickening visualized. No sonographic Murphy sign noted by sonographer. Common bile duct: Diameter: 3.3 mm Liver: Hepatic steatosis. No focal  mass. Portal vein is patent on color Doppler imaging with normal direction of blood flow towards the liver. IVC: No abnormality visualized. Pancreas: The pancreas was not visualized. Spleen: Size and appearance within normal limits. Right Kidney: Length: 14.1 cm. Echogenicity within normal limits. No mass or hydronephrosis visualized. Left Kidney: Length: 13.6 cm. Echogenicity within normal limits. No mass or hydronephrosis visualized. Abdominal aorta: Not well visualized. Other findings: None. IMPRESSION: The study is limited due to shadowing bowel gas. No cause for sepsis identified. Hepatic steatosis. Electronically Signed   By: Gerome Sam III M.D   On: 12/30/2017 12:51   Dg Chest Port 1 View  Result Date: 12/03/2017 CLINICAL DATA:  57 y/o  M; PICC line placement. EXAM: PORTABLE CHEST 1 VIEW COMPARISON:  12/25/2017 chest radiograph. FINDINGS: Endotracheal tube 1.7 cm above the carina. Enteric tube tip in gastric body. Right PICC line tip projects over right atrium mild reticular opacities of the lungs are stable. No focal consolidation. No pleural effusion or pneumothorax identified. Bones are unremarkable. IMPRESSION: Right PICC line tip projects over right atrium. Stable mild reticular opacities of lungs, possibly vascular congestion. No consolidation. Electronically Signed   By: Mitzi Hansen M.D.   On: 12/07/2017 18:03   Dg Chest Port 1 View  Result Date: 12/28/2017 CLINICAL DATA:  Status post intubation. EXAM: PORTABLE CHEST 1 VIEW COMPARISON:  Radiographs of September 23, 2017. FINDINGS: Stable cardiomediastinal silhouette. Endotracheal tube is seen projected over tracheal air shadow with distal tip 5 cm above the carina. No pneumothorax or pleural effusion is noted. No acute pulmonary disease is noted. Bony thorax is unremarkable. IMPRESSION: Endotracheal tube in grossly good position. No  acute cardiopulmonary abnormality seen. Electronically Signed   By: Lupita RaiderJames  Green Jr, M.D.   On:  04/10/2018 10:05   [place] Ekg Site Rite  Result Date: 12/11/2017 If Site Rite image not attached, placement could not be confirmed due to current cardiac rhythm.  [place] Ekg Site Rite  Result Date: 12/11/2017 If Site Rite image not attached, placement could not be confirmed due to current cardiac rhythm.    ASSESSMENT AND PLAN:    57 year old male with history of EtOH abuse who is brought to the ER due to altered mental status.   1.  Acute respiratory failure due to respiratory arrest: Vent management as per intensivist  2.  Acute metabolic encephalopathy in the setting of EtOH abuse and profound hyponatremia Hypertonic saline protocol initiated Check ammonia level Continue lactulose and Xifaxan  3.  Hypotension after intubation due to sedation: Keep MAP>70  4.  Hypokalemia: Replete Check magnesium level  5.  Acute alcoholic hepatitis: GI consultation appreciated Continue Trental IV thiamine to prevent Warnicke's encephalopathy as per GI recommendations Abdominal ultrasound did not show profound amount of ascites  6.  EtOH withdrawal: Currently sedated on ventilator  7.  Acute kidney injury: This is in the setting of poor p.o. intake and dehydration  8.  Anemia of chronic disease: Continue PPI Continue Rocephin for prophylaxis Appreciate GI consultation Monitor CBC Anemia panel consistent with chronic disease  Management plans discussed with the Dr Sung AmabileSimonds CODE STATUS: FULL  TOTAL TIME TAKING CARE OF THIS PATIENT: 30 minutes.     POSSIBLE D/C 4-6 days, DEPENDING ON CLINICAL CONDITION.   Raekwon Winkowski M.D on 12/25/2017 at 8:39 AM  Between 7am to 6pm - Pager - [phone] After 6pm go to [email or web address] - password [email or web address]  [facility]  Office  [phone]  CC: Primary care physician; System, Pcp Not In  Note: This dictation was prepared with Dragon dictation along with smaller phrase technology. Any transcriptional errors that result from  this process are unintentional.

## 2017-12-26 ENCOUNTER — Inpatient Hospital Stay: Payer: Medicaid - Out of State

## 2017-12-26 DIAGNOSIS — R579 Shock, unspecified: Secondary | ICD-10-CM

## 2017-12-26 DIAGNOSIS — K767 Hepatorenal syndrome: Secondary | ICD-10-CM

## 2017-12-26 DIAGNOSIS — K7290 Hepatic failure, unspecified without coma: Secondary | ICD-10-CM

## 2017-12-26 DIAGNOSIS — N179 Acute kidney failure, unspecified: Secondary | ICD-10-CM

## 2017-12-26 DIAGNOSIS — K7201 Acute and subacute hepatic failure with coma: Secondary | ICD-10-CM

## 2017-12-26 DIAGNOSIS — J9601 Acute respiratory failure with hypoxia: Secondary | ICD-10-CM

## 2017-12-26 LAB — HEPATIC FUNCTION PANEL

## 2017-12-26 LAB — BASIC METABOLIC PANEL
ANION GAP: 12 (ref 5–15)
BUN: 19 mg/dL (ref 6–20)
CHLORIDE: 99 mmol/L — AB (ref 101–111)
CO2: 13 mmol/L — ABNORMAL LOW (ref 22–32)
Calcium: 6.9 mg/dL — ABNORMAL LOW (ref 8.9–10.3)
Creatinine, Ser: UNDETERMINED mg/dL (ref 0.61–1.24)
GLUCOSE: 133 mg/dL — AB (ref 65–99)
POTASSIUM: 3 mmol/L — AB (ref 3.5–5.1)
Sodium: 124 mmol/L — ABNORMAL LOW (ref 135–145)

## 2017-12-26 LAB — GLUCOSE, CAPILLARY
Glucose-Capillary: 103 mg/dL — ABNORMAL HIGH (ref 65–99)
Glucose-Capillary: 139 mg/dL — ABNORMAL HIGH (ref 65–99)
Glucose-Capillary: 140 mg/dL — ABNORMAL HIGH (ref 65–99)
Glucose-Capillary: 141 mg/dL — ABNORMAL HIGH (ref 65–99)
Glucose-Capillary: 149 mg/dL — ABNORMAL HIGH (ref 65–99)
Glucose-Capillary: 151 mg/dL — ABNORMAL HIGH (ref 65–99)
Glucose-Capillary: 84 mg/dL (ref 65–99)

## 2017-12-26 LAB — SODIUM
SODIUM: 122 mmol/L — AB (ref 135–145)
Sodium: 122 mmol/L — ABNORMAL LOW (ref 135–145)

## 2017-12-26 LAB — CBC
HCT: 23.2 % — ABNORMAL LOW (ref 40.0–52.0)
HEMOGLOBIN: 7.7 g/dL — AB (ref 13.0–18.0)
MCH: 36.9 pg — AB (ref 26.0–34.0)
MCHC: 33.2 g/dL (ref 32.0–36.0)
MCV: 111.2 fL — AB (ref 80.0–100.0)
PLATELETS: 152 10*3/uL (ref 150–440)
RBC: 2.09 MIL/uL — AB (ref 4.40–5.90)
RDW: 16.1 % — ABNORMAL HIGH (ref 11.5–14.5)
WBC: 21.6 10*3/uL — AB (ref 3.8–10.6)

## 2017-12-26 LAB — MAGNESIUM: Magnesium: 1.9 mg/dL (ref 1.7–2.4)

## 2017-12-26 LAB — COMPREHENSIVE METABOLIC PANEL
ALBUMIN: 1.7 g/dL — AB (ref 3.5–5.0)
ALT: 71 U/L — ABNORMAL HIGH (ref 17–63)
AST: 199 U/L — AB (ref 15–41)
Alkaline Phosphatase: 708 U/L — ABNORMAL HIGH (ref 38–126)
Anion gap: 8 (ref 5–15)
BILIRUBIN TOTAL: 25 mg/dL — AB (ref 0.3–1.2)
BUN: 19 mg/dL (ref 6–20)
CO2: 16 mmol/L — AB (ref 22–32)
Calcium: 7.1 mg/dL — ABNORMAL LOW (ref 8.9–10.3)
Chloride: 99 mmol/L — ABNORMAL LOW (ref 101–111)
Creatinine, Ser: UNDETERMINED mg/dL (ref 0.61–1.24)
GLUCOSE: 142 mg/dL — AB (ref 65–99)
Potassium: 3.4 mmol/L — ABNORMAL LOW (ref 3.5–5.1)
SODIUM: 123 mmol/L — AB (ref 135–145)
Total Protein: 5.2 g/dL — ABNORMAL LOW (ref 6.5–8.1)

## 2017-12-26 LAB — EPSTEIN-BARR VIRUS VCA, IGM: EBV VCA IgM: <36 U/mL (ref 0.0–35.9)

## 2017-12-26 LAB — HEPATITIS B SURFACE ANTIGEN: HEP B S AG: NEGATIVE

## 2017-12-26 LAB — HEPATITIS B CORE ANTIBODY, IGM: HEP B C IGM: NEGATIVE

## 2017-12-26 LAB — PROCALCITONIN: Procalcitonin: 3.21 ng/mL

## 2017-12-26 LAB — PHOSPHORUS: Phosphorus: UNDETERMINED mg/dL (ref 2.5–4.6)

## 2017-12-26 LAB — HEPATITIS B E ANTIGEN: HEP B E AG: NEGATIVE

## 2017-12-26 LAB — PROTIME-INR
INR: 1.25
Prothrombin Time: 15.6 seconds — ABNORMAL HIGH (ref 11.4–15.2)

## 2017-12-26 LAB — CREATININE, SERUM: CREATININE: UNDETERMINED mg/dL (ref 0.61–1.24)

## 2017-12-26 LAB — HEPATITIS B DNA, ULTRAQUANTITATIVE, PCR
HBV DNA SERPL PCR-ACNC: NOT DETECTED [IU]/mL
HBV DNA SERPL PCR-LOG IU: UNDETERMINED log10 IU/mL

## 2017-12-26 LAB — AMMONIA: AMMONIA: 151 umol/L — AB (ref 9–35)

## 2017-12-26 LAB — APTT: APTT: 38 s — AB (ref 24–36)

## 2017-12-26 LAB — HEPATITIS A ANTIBODY, IGM: HEP A IGM: NEGATIVE

## 2017-12-26 MED ORDER — POTASSIUM CHLORIDE 10 MEQ/50ML IV SOLN
10.0000 meq | INTRAVENOUS | Status: DC
  Administered 2017-12-26: 10 meq via INTRAVENOUS
  Filled 2017-12-26 (×4): qty 50

## 2017-12-26 MED ORDER — FENTANYL CITRATE (PF) 100 MCG/2ML IJ SOLN
25.0000 ug | INTRAMUSCULAR | Status: DC | PRN
  Administered 2017-12-26: 50 ug via INTRAVENOUS
  Filled 2017-12-26: qty 2

## 2017-12-26 MED ORDER — ADULT MULTIVITAMIN LIQUID CH
15.0000 mL | Freq: Every day | ORAL | Status: DC
  Administered 2017-12-26: 15 mL via ORAL
  Filled 2017-12-26 (×2): qty 15

## 2017-12-26 MED ORDER — SODIUM CHLORIDE 0.9 % IV SOLN
INTRAVENOUS | Status: DC
  Administered 2017-12-26: 02:00:00 via INTRAVENOUS

## 2017-12-26 MED ORDER — HYDROCORTISONE NA SUCCINATE PF 100 MG IJ SOLR
50.0000 mg | Freq: Two times a day (BID) | INTRAMUSCULAR | Status: DC
  Administered 2017-12-26 – 2017-12-27 (×2): 50 mg via INTRAVENOUS
  Filled 2017-12-26 (×2): qty 2

## 2017-12-26 MED ORDER — FENTANYL CITRATE (PF) 100 MCG/2ML IJ SOLN: 50.0000 ug | INTRAMUSCULAR | Status: DC | PRN

## 2017-12-26 MED ORDER — MIDAZOLAM HCL 2 MG/2ML IJ SOLN
1.0000 mg | INTRAMUSCULAR | Status: DC | PRN
  Administered 2017-12-27 (×2): 2 mg via INTRAVENOUS
  Filled 2017-12-26 (×2): qty 2

## 2017-12-26 MED ORDER — BISACODYL 10 MG RE SUPP: 10.0000 mg | Freq: Every day | RECTAL | Status: DC | PRN

## 2017-12-26 MED ORDER — POTASSIUM CHLORIDE 10 MEQ/50ML IV SOLN
10.0000 meq | INTRAVENOUS | Status: AC
  Administered 2017-12-26 – 2017-12-27 (×4): 10 meq via INTRAVENOUS
  Filled 2017-12-26 (×4): qty 50

## 2017-12-26 MED ORDER — POTASSIUM CHLORIDE 10 MEQ/50ML IV SOLN
10.0000 meq | INTRAVENOUS | Status: AC
  Administered 2017-12-26 (×4): 10 meq via INTRAVENOUS
  Filled 2017-12-26 (×4): qty 50

## 2017-12-26 MED ORDER — LORAZEPAM 2 MG/ML IJ SOLN
1.0000 mg | INTRAMUSCULAR | Status: DC | PRN
  Administered 2017-12-26: 1 mg via INTRAVENOUS
  Filled 2017-12-26: qty 1

## 2017-12-26 MED ORDER — ALBUMIN HUMAN 25 % IV SOLN
100.0000 g | Freq: Every day | INTRAVENOUS | Status: AC
  Administered 2017-12-26 – 2017-12-27 (×2): 100 g via INTRAVENOUS
  Filled 2017-12-26 (×3): qty 400

## 2017-12-26 MED ORDER — POTASSIUM CHLORIDE 10 MEQ/50ML IV SOLN
10.0000 meq | INTRAVENOUS | Status: AC
  Administered 2017-12-26 (×2): 10 meq via INTRAVENOUS
  Filled 2017-12-26: qty 50

## 2017-12-26 MED ORDER — SODIUM CHLORIDE 0.9 % IV SOLN
INTRAVENOUS | Status: DC
  Administered 2017-12-26 (×2): via INTRAVENOUS

## 2017-12-26 MED ORDER — BISACODYL 10 MG RE SUPP
10.0000 mg | Freq: Once | RECTAL | Status: AC
  Administered 2017-12-26: 10 mg via RECTAL
  Filled 2017-12-26: qty 1

## 2017-12-26 NOTE — Progress Notes (Signed)
PULMONARY / CRITICAL CARE MEDICINE   Name: Jack Lowe MRN: [id number] DOB: [birth year]    ADMISSION DATE:  01-15-18 CONSULTATION DATE:  04/23  PT PROFILE:   31 M with history of alcohol abuse, alcoholic hepatitis, delirium tremens, schizophrenia brought to ED 04/23 by family for AMS, profound jaundice.  Found to be profoundly hyponatremic (Na 106).  Admitted to ICU/SDU.  Suffered apneic respiratory arrest in radiology prior to abdominal ultrasound.  MAJOR EVENTS/TEST RESULTS: 04/23 admission as documented above 04/23 suffered respiratory arrest in radiology.  Intubated 04/23 CT head: No acute findings 04/23 CTAP: Fatty liver.  Otherwise, no acute findings 04/23 Abd [place]: The study is limited due to shadowing bowel gas. No cause for sepsis identified. Hepatic steatosis  04/23 Hypotension > norepinephrine initiated 04/24 Requiring high dose norepi (36 mcg/min). Vasopressin initiated. Remains on hypertonic saline. Na gradually improving. Requiring sedation. High Ve prohibits weaning. Vent changes made. Oliguric (Uo 280cc/24 hrs). Propofol changed to dex gtt + intermittent lorazepam + intermittent fentanyl.  Hypertonic saline discontinued for sodium 136 (which was probably a spurious value) 04/25 intermittently agitated requiring dexmedetomidine infusion.  Tolerates PSV 10 cm H2O.  Worsening abdominal distention.  KUB consistent with ileus pattern.  Albumin initiated by gastroenterology.  Vasopressor requirements improving.  INDWELLING DEVICES:: ETT 04/23 >>  RUE PICC 04/23 >>   MICRO DATA: MRSA PCR 04/23 >> NEG  Blood 04/23 >>  Resp 04/24 >> Pseudomonas  ANTIMICROBIALS:  Ceftriaxone 04/23 >> 04/24 Cefepime 04/24 >>   SUBJECTIVE:  Intermittently agitated requiring dexmedetomidine infusion.  Tolerates PSV 10 cm H2O.  Worsening abdominal distention.  KUB consistent with ileus pattern.  Albumin initiated by gastroenterology.  Vasopressor requirements improving.  VITAL SIGNS: BP  (!) 98/54   Pulse 75   Temp 98.8 F (37.1 C) (Axillary)   Resp (!) 21   Ht 5\' 10"  (1.778 m)   Wt 218 lb 7.6 oz (99.1 kg)   SpO2 98%   BMI 31.35 kg/m   HEMODYNAMICS:    VENTILATOR SETTINGS: Vent Mode: PSV FiO2 (%):  [35 %] 35 % Set Rate:  [15 bmp] 15 bmp Vt Set:  [600 mL] 600 mL PEEP:  [5 cmH20] 5 cmH20 Pressure Support:  [10 cmH20] 10 cmH20  INTAKE / OUTPUT: I/O last 3 completed shifts: In: 5802.5 [I.V.:4222.5; NG/GT:180; IV Piggyback:1400] Out: 1375 [Urine:1300; Emesis/NG output:75]  PHYSICAL EXAMINATION: General: Profoundly icteric, intubated, sedate, not F/C Neuro: Cranial nerves intact, moves all extremities HEENT: NCAT, severe sclericterus Cardiovascular: Regular, no murmur Lungs: Clear anteriorly Abdomen: Markedly distended, diffuse tympany, absent BS Ext: Warm, no edema Skin: Severe jaundice  LABS:  BMET Recent Labs  Lab 12/25/17 2113  12/26/17 0423 12/26/17 4098 12/26/17 0836 12/26/17 0933  NA 122*   < > TEST WILL BE CREDITED 123* 122*  --   K 3.1*  --  TEST WILL BE CREDITED 3.4*  --   --   CL 96*  --  TEST WILL BE CREDITED 99*  --   --   CO2 13*  --  TEST WILL BE CREDITED 16*  --   --   BUN 19  --  TEST WILL BE CREDITED 19  --   --   CREATININE 1.66*  --  TEST WILL BE CREDITED UNABLE TO REPORT DUE TO ICTERUS  --   UNABLE TO REPORT DUE TO ICTERIC INTERFERENCE SDR  GLUCOSE 180*  --  TEST WILL BE CREDITED 142*  --   --    < > = values  in this interval not displayed.    Electrolytes Recent Labs  Lab 12/30/2017 0050  12/25/17 2113 12/26/17 0423 12/26/17 1610  CALCIUM 7.4*   < > 6.6* TEST WILL BE CREDITED 7.1*  MG 1.4*  --   --  TEST WILL BE CREDITED 1.9  PHOS UNABLE TO REPORT DUE TO ICTERUS  --   --  TEST WILL BE CREDITED UNABLE TO REPORT DUE TO ICTERUS   < > = values in this interval not displayed.    CBC Recent Labs  Lab 12/22/2017 0050 12/25/17 0500 12/26/17 0423  WBC 22.1* 19.3* 21.6*  HGB 9.0* 8.6* 7.7*  HCT 26.4* 25.5* 23.2*  PLT  171 197 152    Coag's Recent Labs  Lab 12/04/2017 0050 12/26/17 0423  APTT 43* 38*  INR 1.12 1.25    Sepsis Markers Recent Labs  Lab 12/11/2017 0050 12/31/2017 0522 12/25/17 1222 12/26/17 0423  LATICACIDVEN 2.7* 1.3  --   --   PROCALCITON  --   --  5.70 3.21    ABG Recent Labs  Lab 12/28/2017 1118  PHART 7.18*  PCO2ART 51*  PO2ART 70*    Liver Enzymes Recent Labs  Lab 12/25/17 0500 12/26/17 0423 12/26/17 0638  AST 207* TEST WILL BE CREDITED 199*  ALT 69* TEST WILL BE CREDITED 71*  ALKPHOS 705* TEST WILL BE CREDITED 708*  BILITOT 24.4* TEST WILL BE CREDITED 25.0*  ALBUMIN 1.7* TEST WILL BE CREDITED 1.7*    Cardiac Enzymes Recent Labs  Lab 12/23/2017 0050  TROPONINI 0.10*    Glucose Recent Labs  Lab 12/25/17 1954 12/26/17 0000 12/26/17 0357 12/26/17 0731 12/26/17 1131 12/26/17 1555  GLUCAP 125* 151* 140* 139* 149* 141*    CXR: Improved RLL ATX vs infiltrate    ASSESSMENT / PLAN:  NEUROLOGIC A:   Chronic alcohol abuse  Hx of DTs Acute encephalopathy due to hyponatremia Hyperammonemia /hepatic encephalopathy ICU/ventilator associated discomfort P:   RASS goal: 0 Continue dexmedetomidine infusion (initiated 04/24) Cont intermittent fentanyl and lorazepam Cont lactulose and rifaximin  Continue normal saline at current rate Cont high dose thiamine  PULMONARY A: VDRF after respiratory arrest 04/23 Smoker with documented history of COPD  No bronchospasm presently Likely RLL pneumonia P:   Cont vent support - settings reviewed and/or adjusted Wean in PSV mode as tolerated Cont vent bundle Daily SBT if/when meets criteria  Cont PRN nebs Cannot be extubated until cognition improves significantly  CARDIOVASCULAR A:  Shock -likely multifactorial P:  MAP goal >70 mmHg Cont norepinephrine  Vasopressin initiated 04/24, DC'd 04/25 Albumin initiated 04/25 by gastroenterology  RENAL A:   Profound hyponatremia, improving Mild metabolic  acidosis Oliguria resolved AKI, likely improving   (very elevated bilirubin is interfering with Cr assay) Hypokalemia P:   Cont NS Monitor BMET intermittently Monitor I/Os Correct electrolytes as indicated  Recheck BMET this afternoon K+ repleted 04/25  GASTROINTESTINAL A:   Acute hepatic failure Alcoholic hepatitis Ileus P:   SUP: IV PPI Pentoxifylline initiated 04/23 Holding TFs for now  HEMATOLOGIC A:   Macrocytic anemia P:  DVT px: SCDs Monitor CBC intermittently Transfuse per usual guidelines  Cont Folate   INFECTIOUS A:   RLL Pseudomonas pneumonia Elevated PCT P:   Monitor temp, WBC count Micro and abx as above  Following PCT  ENDOCRINE A:   No acute issues P:   Monitor CBGs intermittently   FAMILY: Daughters updated at bedside today.  They understand that his prognosis for survival is poor.  However,  he has improved over the past 24 hours.  I have encouraged that we consider DNR in event of cardiac arrest.  They are reluctant to make that decision at this time.  It is appropriate for us to continue with her current aggressive measures for now.  CCM time: 45 mins The above time includes time spent in consultation with patient and/or family members and reviewing care plan on multidisciplinary rounds  Billy Fischeravid Simonds, MD PCCM service Mobile [phone] Pager [phone] 12/26/2017, 4:56 PM

## 2017-12-26 NOTE — Progress Notes (Signed)
Pharmacy Antibiotic Note  Jack Lowe is a 57 y.o. male admitted on 12/26/2017 with sepsis.  Patient with history significant for chronic alcohol abuse presenting with severe hyponatremia. Patient is intubated and requiring pressors. Pharmacy has been consulted for cefepime and vancomycin dosing.   Plan: Cefepime 2g IV Q12hr.   Vancomycin discontinued on am rounds on 4/25.   Height: 5\' 10"  (177.8 cm) Weight: 218 lb 7.6 oz (99.1 kg) IBW/kg (Calculated) : 73  Temp (24hrs), Avg:99.1 F (37.3 C), Min:98.5 F (36.9 C), Max:99.8 F (37.7 C)  Recent Labs  Lab 12/13/2017 0050 12/31/2017 0522  12/25/17 0500 12/25/17 1721 12/25/17 2113 12/26/17 0423 12/26/17 0638 12/26/17 0933  WBC 22.1*  --   --  19.3*  --   --  21.6*  --   --   CREATININE UNABLE TO REPORT DUE TO ICTERUS UNABLE TO REPORT DUE TO ICTERUS FLC   < > 1.87* 1.39* 1.66* TEST WILL BE CREDITED UNABLE TO REPORT DUE TO ICTERUS  UNABLE TO REPORT DUE TO ICTERIC INTERFERENCE SDR  LATICACIDVEN 2.7* 1.3  --   --   --   --   --   --   --    < > = values in this interval not displayed.    CrCl cannot be calculated (This lab value cannot be used to calculate CrCl because it is not a number:  UNABLE TO REPORT DUE TO ICTERIC INTERFERENCE SDR).    Allergies  Allergen Reactions  . Haldol [Haloperidol] Swelling    Tongue swelling  . Toradol [Ketorolac Tromethamine] Swelling    Antimicrobials this admission: Ceftriaxone 4/23 >> 4/24  Cefepime 4/24 >>  Vancomycin 4/24 >> 4/25  Dose adjustments this admission: 4/24 Ceftriaxone transitioned to 1g by CCM   Microbiology results: 4/23 BCx: no growth x 2 day  4/24 Sputum: pseudomonas aeruginosa   4/23 MRSA PCR: negative   Thank you for allowing pharmacy to be a part of this patient's care.  Simpson,Michael L 12/26/2017 1:49 PM

## 2017-12-26 NOTE — Progress Notes (Signed)
[facility]  ROUNDING NOTE   Subjective:   UOP 1120  Creatinine unable to be calculated due to elevated bilirubin interferring with creatinine assay.   Norepinephrine and vasopressin gtt  Na 122 NS at 79m/hr  Objective:  Vital signs in last 24 hours:  Temp:  [98.5 F (36.9 C)-99.8 F (37.7 C)] 98.6 F (37 C) (04/25 0720) Pulse Rate:  [65-110] 66 (04/25 0800) Resp:  [15-34] 16 (04/25 0800) BP: (75-155)/(45-92) 112/58 (04/25 0800) SpO2:  [95 %-100 %] 100 % (04/25 0800) FiO2 (%):  [35 %-40 %] 35 % (04/25 0415) Weight:  [99.1 kg (218 lb 7.6 oz)] 99.1 kg (218 lb 7.6 oz) (04/25 0414)  Weight change: 2.5 kg (5 lb 8.2 oz) Filed Weights   12/12/2017 0400 12/25/17 0500 12/26/17 0414  Weight: 94.8 kg (208 lb 15.9 oz) 96.6 kg (212 lb 15.4 oz) 99.1 kg (218 lb 7.6 oz)    Intake/Output: I/O last 3 completed shifts: In: 5802.5 [I.V.:4222.5; NG/GT:180; IV PCBSWHQPRF:1638]Out: 14665[Urine:1300; Emesis/NG output:75]   Intake/Output this shift:  Total I/O In: -  Out: 305 [Urine:175; Emesis/NG output:130]  Physical Exam: General: Critically Ill  Head: +ETT, +OGT  Eyes: +icterus  Neck: Supple, trachea midline  Lungs:  Bilateral crackles, PRVC FIO2 35%  Heart: Regular rate and rhythm  Abdomen:  +distended  Extremities:  + peripheral edema.  Neurologic: Intubated, sedated  Skin: +jaundice  GU: Foley with dark urine    Basic Metabolic Panel: Recent Labs  Lab 12/04/2017 0050  12/25/17 0500  12/25/17 1721 12/25/17 2113 12/26/17 0103 12/26/17 0423 12/26/17 0638  NA 106*   < > 117*   < > 136 122* 122* TEST WILL BE CREDITED 123*  K 3.6   < > 2.9*  --  2.6* 3.1*  --  TEST WILL BE CREDITED 3.4*  CL 72*   < > 89*  --  117* 96*  --  TEST WILL BE CREDITED 99*  CO2 16*   < > 18*  --  12* 13*  --  TEST WILL BE CREDITED 16*  GLUCOSE 77   < > 112*  --  183* 180*  --  TEST WILL BE CREDITED 142*  BUN 17   < > 22*  --  17 19  --  TEST WILL BE CREDITED 19  CREATININE UNABLE  TO REPORT DUE TO ICTERUS   < > 1.87*  --  1.39* 1.66*  --  TEST WILL BE CREDITED UNABLE TO REPORT DUE TO ICTERUS  CALCIUM 7.4*   < > 6.7*  --  5.2* 6.6*  --  TEST WILL BE CREDITED 7.1*  MG 1.4*  --   --   --   --   --   --  TEST WILL BE CREDITED 1.9  PHOS UNABLE TO REPORT DUE TO ICTERUS  --   --   --   --   --   --  TEST WILL BE CREDITED UNABLE TO REPORT DUE TO ICTERUS   < > = values in this interval not displayed.    Liver Function Tests: Recent Labs  Lab 12/26/2017 0050 12/25/17 0500 12/26/17 0423 12/26/17 0638  AST 174* 207* TEST WILL BE CREDITED 199*  ALT 78* 69* TEST WILL BE CREDITED 71*  ALKPHOS 810* 705* TEST WILL BE CREDITED 708*  BILITOT 27.5* 24.4* TEST WILL BE CREDITED 25.0*  PROT 5.3* 4.6* TEST WILL BE CREDITED 5.2*  ALBUMIN 1.9* 1.7* TEST WILL BE CREDITED 1.7*   Recent Labs  Lab 12/21/2017 0050  LIPASE 31   Recent Labs  Lab 12/16/2017 0050 12/26/17 0423  AMMONIA 140* 151*    CBC: Recent Labs  Lab 12/08/2017 0050 12/25/17 0500 12/26/17 0423  WBC 22.1* 19.3* 21.6*  NEUTROABS 20.1*  --   --   HGB 9.0* 8.6* 7.7*  HCT 26.4* 25.5* 23.2*  MCV 110.6* 110.9* 111.2*  PLT 171 197 152    Cardiac Enzymes: Recent Labs  Lab 12/26/2017 0050 12/20/2017 1923  CKTOTAL  --  78  TROPONINI 0.10*  --     BNP: Invalid input(s): POCBNP  CBG: Recent Labs  Lab 12/25/17 0756 12/25/17 1200 12/25/17 1954 12/26/17 0000 12/26/17 0357  GLUCAP 104* 131* 125* 151* 140*    Microbiology: Results for orders placed or performed during the hospital encounter of 12/10/2017  Blood culture (routine x 2)     Status: None (Preliminary result)   Collection Time: 12/26/2017  2:52 AM  Result Value Ref Range Status   Specimen Description BLOOD LEFT HAND  Final   Special Requests   Final    BOTTLES DRAWN AEROBIC AND ANAEROBIC Blood Culture results may not be optimal due to an excessive volume of blood received in culture bottles   Culture   Final    NO GROWTH 2 DAYS Performed at [facility], [address], [place], [place] [address]    Report Status PENDING  Incomplete  Blood culture (routine x 2)     Status: None (Preliminary result)   Collection Time: 12/11/2017  2:52 AM  Result Value Ref Range Status   Specimen Description BLOOD BLOOD RIGHT WRIST  Final   Special Requests   Final    BOTTLES DRAWN AEROBIC AND ANAEROBIC Blood Culture results may not be optimal due to an excessive volume of blood received in culture bottles   Culture   Final    NO GROWTH 2 DAYS Performed at [facility], [address], [place], [place] [address]    Report Status PENDING  Incomplete  MRSA PCR Screening     Status: None   Collection Time: 12/15/2017 11:27 AM  Result Value Ref Range Status   MRSA by PCR NEGATIVE NEGATIVE Final    Comment:        The GeneXpert MRSA Assay (FDA approved for NASAL specimens only), is one component of a comprehensive MRSA colonization surveillance program. It is not intended to diagnose MRSA infection nor to guide or monitor treatment for MRSA infections. Performed at [facility], [address] [place], [place] [address]   Culture, respiratory (NON-Expectorated)     Status: None (Preliminary result)   Collection Time: 12/25/17 11:54 AM  Result Value Ref Range Status   Specimen Description   Final    TRACHEAL ASPIRATE Performed at [facility], [address], [place], [place] [address]    Special Requests   Final    NONE Performed at [facility], [address] [place], [place] [address]    Gram Stain   Final    RARE WBC PRESENT, PREDOMINANTLY PMN MODERATE GRAM NEGATIVE RODS Performed at [facility], [place] [address] [place], [place] [address]    Culture PENDING  Incomplete   Report Status PENDING  Incomplete    Coagulation Studies: Recent Labs    12/16/2017 0050 12/26/17 0423  LABPROT 14.3 15.6*  INR 1.12 1.25    Urinalysis: No results for input(s): COLORURINE, LABSPEC,  PHURINE, GLUCOSEU, HGBUR, BILIRUBINUR, KETONESUR, PROTEINUR, UROBILINOGEN, NITRITE,  LEUKOCYTESUR in the last 72 hours.  Invalid input(s): APPERANCEUR    Imaging: Ct Abdomen Pelvis Wo Contrast  Result Date: 12/29/2017 CLINICAL DATA:  Abdominal distension. EXAM: CT ABDOMEN AND PELVIS WITHOUT CONTRAST TECHNIQUE: Multidetector CT imaging of the abdomen and pelvis was performed following the standard protocol without IV contrast. COMPARISON:  None. FINDINGS: Lower chest: Mild bilateral posterior basilar subsegmental atelectasis is noted. Hepatobiliary: No gallstones are noted. Fatty infiltration of the liver is noted. No biliary dilatation is noted. Pancreas: Unremarkable. No pancreatic ductal dilatation or surrounding inflammatory changes. Spleen: Normal in size without focal abnormality. Adrenals/Urinary Tract: Adrenal glands are unremarkable. Kidneys are normal, without renal calculi, focal lesion, or hydronephrosis. Bladder is unremarkable. Stomach/Bowel: Stomach is within normal limits. Appendix appears normal. No evidence of bowel wall thickening, distention, or inflammatory changes. Vascular/Lymphatic: Aortic atherosclerosis. No enlarged abdominal or pelvic lymph nodes. Reproductive: Prostate is unremarkable. Other: No abnormal fluid collection is noted. Small fat containing right inguinal hernia is noted. Musculoskeletal: No acute or significant osseous findings. IMPRESSION: Fatty infiltration of the liver. No other significant abnormality seen in the abdomen or pelvis. Electronically Signed   By: Marijo Conception, M.D.   On: 12/02/2017 10:26   Dg Abd 1 View  Result Date: 12/17/2017 CLINICAL DATA:  Nasogastric tube placement. EXAM: ABDOMEN - 1 VIEW COMPARISON:  Radiographs of September 23, 2017. FINDINGS: The bowel gas pattern is normal. Distal tip of nasogastric tube is seen in proximal stomach. No radio-opaque calculi or other significant radiographic abnormality are seen. IMPRESSION: Distal tip of  nasogastric tube seen in proximal stomach. No definite evidence of bowel obstruction. Electronically Signed   By: Marijo Conception, M.D.   On: 12/13/2017 12:50   Ct Head Wo Contrast  Result Date: 12/12/2017 CLINICAL DATA:  Status post cardiac arrest with altered mental status EXAM: CT HEAD WITHOUT CONTRAST TECHNIQUE: Contiguous axial images were obtained from the base of the skull through the vertex without intravenous contrast. COMPARISON:  None. FINDINGS: Brain: The ventricles are normal in size and configuration. There is no intracranial mass, hemorrhage, extra-axial fluid collection, or midline shift. The gray-white compartments appear normal. No evident acute infarct. Vascular: No hyperdense vessel. No appreciable vascular calcification evident. Skull: The bony calvarium appears intact. Sinuses/Orbits: There is patchy opacity in several ethmoid air cells. There is slight mucosal thickening in the anterior sphenoid sinus regions. Other visualized paranasal sinuses are clear. Frontal sinuses are somewhat hypoplastic. Orbits appear symmetric bilaterally. Other: Mastoid air cells are clear. IMPRESSION: No mass or hemorrhage. Gray-white compartments appear normal. Mild paranasal sinus disease noted. Electronically Signed   By: Lowella Grip III M.D.   On: 12/03/2017 10:16   US Abdomen Complete  Result Date: 12/08/2017 CLINICAL DATA:  Sepsis EXAM: ABDOMEN ULTRASOUND COMPLETE COMPARISON:  None. FINDINGS: Gallbladder: No gallstones or wall thickening visualized. No sonographic Murphy sign noted by sonographer. Common bile duct: Diameter: 3.3 mm Liver: Hepatic steatosis. No focal mass. Portal vein is patent on color Doppler imaging with normal direction of blood flow towards the liver. IVC: No abnormality visualized. Pancreas: The pancreas was not visualized. Spleen: Size and appearance within normal limits. Right Kidney: Length: 14.1 cm. Echogenicity within normal limits. No mass or hydronephrosis  visualized. Left Kidney: Length: 13.6 cm. Echogenicity within normal limits. No mass or hydronephrosis visualized. Abdominal aorta: Not well visualized. Other findings: None. IMPRESSION: The study is limited due to shadowing bowel gas. No cause for sepsis identified. Hepatic steatosis. Electronically Signed   By: Dorise Bullion  III M.D   On: 12/21/2017 12:51   Dg Chest Port 1 View  Result Date: 12/25/2017 CLINICAL DATA:  COPD, hepatic failure and alcohol withdrawal. Current smoker. Intubated patient. EXAM: PORTABLE CHEST 1 VIEW COMPARISON:  Chest x-ray of December 24, 2017 FINDINGS: The left lung is well-expanded. There is minimal atelectasis or scarring at the left base. On the right there is mild volume loss with persistent increased density largely obscuring the hemidiaphragm. There is a trace of pleural fluid on the right. There is no pneumothorax. The endotracheal tube tip projects 2.9 cm above the carina. The esophagogastric tube tip and proximal port project below the GE junction. The right-sided PICC line tip projects over the distal third of the SVC. IMPRESSION: Persistent increased density at the right lung base may reflect atelectasis or pneumonia. Probable trace right pleural effusion. The support tubes are in reasonable position. Electronically Signed   By: David  Martinique M.D.   On: 12/25/2017 10:23   Dg Chest Port 1 View  Result Date: 12/19/2017 CLINICAL DATA:  58 y/o  M; PICC line placement. EXAM: PORTABLE CHEST 1 VIEW COMPARISON:  12/17/2017 chest radiograph. FINDINGS: Endotracheal tube 1.7 cm above the carina. Enteric tube tip in gastric body. Right PICC line tip projects over right atrium mild reticular opacities of the lungs are stable. No focal consolidation. No pleural effusion or pneumothorax identified. Bones are unremarkable. IMPRESSION: Right PICC line tip projects over right atrium. Stable mild reticular opacities of lungs, possibly vascular congestion. No consolidation. Electronically  Signed   By: Kristine Garbe M.D.   On: 12/19/2017 18:03   Dg Chest Port 1 View  Result Date: 12/06/2017 CLINICAL DATA:  Status post intubation. EXAM: PORTABLE CHEST 1 VIEW COMPARISON:  Radiographs of September 23, 2017. FINDINGS: Stable cardiomediastinal silhouette. Endotracheal tube is seen projected over tracheal air shadow with distal tip 5 cm above the carina. No pneumothorax or pleural effusion is noted. No acute pulmonary disease is noted. Bony thorax is unremarkable. IMPRESSION: Endotracheal tube in grossly good position. No acute cardiopulmonary abnormality seen. Electronically Signed   By: Marijo Conception, M.D.   On: 12/20/2017 10:05   [place] Ekg Site Rite  Result Date: 12/31/2017 If Site Rite image not attached, placement could not be confirmed due to current cardiac rhythm.  [place] Ekg Site Rite  Result Date: 12/19/2017 If Site Rite image not attached, placement could not be confirmed due to current cardiac rhythm.    Medications:   . sodium chloride 50 mL/hr at 12/26/17 0642  . ceFEPime (MAXIPIME) IV Stopped (12/26/17 0442)  . dexmedetomidine (PRECEDEX) IV infusion 1.2 mcg/kg/hr (12/26/17 0641)  . norepinephrine (LEVOPHED) Adult infusion 6 mcg/min (12/26/17 0520)  . potassium chloride    . sodium chloride    . thiamine injection Stopped (12/25/17 1135)  . vasopressin (PITRESSIN) infusion - *FOR SHOCK* 0.03 Units/min (12/25/17 2049)   . bisacodyl  10 mg Rectal Once  . chlorhexidine gluconate (MEDLINE KIT)  15 mL Mouth Rinse BID  . hydrocortisone sod succinate (SOLU-CORTEF) inj  50 mg Intravenous Q12H  . lactulose  30 g Per Tube TID  . mouth rinse  15 mL Mouth Rinse QID  . multivitamin  15 mL Oral Daily  . pantoprazole  40 mg Intravenous Q12H  . pentoxifylline  400 mg Oral Q breakfast  . rifaximin  400 mg Per Tube TID   bisacodyl, fentaNYL (SUBLIMAZE) injection, ipratropium-albuterol, LORazepam, sodium chloride flush  Assessment/ Plan:  Mr. Jack Lowe is a 57  y.o. white male with schizophrenia, hypertension, COPD, alcohol abuse , who was admitted to [facility] on 12/17/2017.   1. Acute Renal Failure: baseline creatinine 0.5 09/24/2017. Nonoliguric. Creatinine unable to be measured as hyperbilirubinemia causing interference with assay.   Concerning for development of hepatorenal syndrome.  - Agree with GI input.    2. Hyponatremia: 106 on admission. Currently on hypertonic saline. With hypervolemia - Correction of sodium is appropriate. 122 on last check - Completed course of hypertonic saline.  - NS at 81m/hr  3. Hypokalemia: with metabolic acidosis - status post IV potassium replacement.   4. Acute hepatitis: - systemic steroids - pentoxifylline - MELD score of 30 on 4/24. Which is a 52.6% mortality in the next 3 months.  - Appreciate GI input.     LOS: 2 Nafeesa Dils 4/25/20199:06 AM

## 2017-12-26 NOTE — Progress Notes (Signed)
Jack Lowe , MD [address] [place], [place], [place], [address] [address], [place], [place], [place], [address] Phone: [phone]  Fax: [phone]   Jack Lowe is being followed for acute alcoholic hepatitis with hepatorenal syndrome   Subjective: Unable to provide any history    Objective: Vital signs in last 24 hours: Vitals:   12/26/17 0700 12/26/17 0720 12/26/17 0800 12/26/17 0900  BP: 105/61  (!) 112/58 104/61  Pulse: 66  66 64  Resp: _0 Temp:  98.6 F (37 C)    TempSrc:  Axillary    SpO2: 100%  100% 100%  Weight:      Height:       Weight change: 5 lb 8.2 oz (2.5 kg)  Intake/Output Summary (Last 24 hours) at 12/26/2017 1125 Last data filed at 12/26/2017 1020 Gross per 24 hour  Intake 3923.97 ml  Output 1575 ml  Net 2348.97 ml     Exam: Heart:: Regular rate and rhythm, S1S2 present or without murmur or extra heart sounds Lungs: normal, clear to auscultation and clear to auscultation and percussion Abdomen: distended , tympanic on percussion , non tender, no guarding or rigidity . BS+   Lab Results: _1 @ Micro Results: Recent Results (from the past 240 hour(s))  Blood culture (routine x 2)     Status: None (Preliminary result)   Collection Time: 12/20/2017  2:52 AM  Result Value Ref Range Status   Specimen Description BLOOD LEFT HAND  Final   Special Requests   Final    BOTTLES DRAWN AEROBIC AND ANAEROBIC Blood Culture results may not be optimal due to an excessive volume of blood received in culture bottles   Culture   Final    NO GROWTH 2 DAYS Performed at [facility], [address] [place], [place] [address]    Report Status PENDING  Incomplete  Blood culture (routine x 2)     Status: None (Preliminary result)   Collection Time: 12/19/2017  2:52 AM  Result Value Ref Range Status   Specimen Description BLOOD BLOOD RIGHT WRIST  Final   Special Requests   Final    BOTTLES DRAWN AEROBIC AND ANAEROBIC Blood  Culture results may not be optimal due to an excessive volume of blood received in culture bottles   Culture   Final    NO GROWTH 2 DAYS Performed at [facility], [address] [place], [place] [address]    Report Status PENDING  Incomplete  MRSA PCR Screening     Status: None   Collection Time: 12/06/2017 11:27 AM  Result Value Ref Range Status   MRSA by PCR NEGATIVE NEGATIVE Final    Comment:        The GeneXpert MRSA Assay (FDA approved for NASAL specimens only), is one component of a comprehensive MRSA colonization surveillance program. It is not intended to diagnose MRSA infection nor to guide or monitor treatment for MRSA infections. Performed at [facility], [address] [place], [place] [address]   Culture, respiratory (NON-Expectorated)     Status: None (Preliminary result)   Collection Time: 12/25/17 11:54 AM  Result Value Ref Range Status   Specimen Description   Final    TRACHEAL ASPIRATE Performed at [facility], [name], [place], [place] [address]    Special Requests   Final    NONE Performed at [facility], [address], [place], [place] [address]    Gram  Stain   Final    RARE WBC PRESENT, PREDOMINANTLY PMN MODERATE GRAM NEGATIVE RODS    Culture   Final    MODERATE PSEUDOMONAS AERUGINOSA SUSCEPTIBILITIES TO FOLLOW Performed at [facility], [place] [address] [place], [place] [address]    Report Status PENDING  Incomplete   Studies/Results: Dg Abd 1 View  Result Date: 12/26/2017 CLINICAL DATA:  Abdominal distention EXAM: ABDOMEN - 1 VIEW COMPARISON:  12/03/2017 FINDINGS: NG tube is in the stomach. Diffuse gaseous distention of the colon and lower abdominal small bowel loops. Small bowel distention is decreased with continued or slightly worsening gaseous distention of the colon. Favor ileus. No free air organomegaly. IMPRESSION: Gaseous distention of distal small bowel loops and colon, most  compatible with ileus. Electronically Signed   By: Rolm Baptise M.D.   On: 12/26/2017 10:22   US Abdomen Complete  Result Date: 12/17/2017 CLINICAL DATA:  Sepsis EXAM: ABDOMEN ULTRASOUND COMPLETE COMPARISON:  None. FINDINGS: Gallbladder: No gallstones or wall thickening visualized. No sonographic Murphy sign noted by sonographer. Common bile duct: Diameter: 3.3 mm Liver: Hepatic steatosis. No focal mass. Portal vein is patent on color Doppler imaging with normal direction of blood flow towards the liver. IVC: No abnormality visualized. Pancreas: The pancreas was not visualized. Spleen: Size and appearance within normal limits. Right Kidney: Length: 14.1 cm. Echogenicity within normal limits. No mass or hydronephrosis visualized. Left Kidney: Length: 13.6 cm. Echogenicity within normal limits. No mass or hydronephrosis visualized. Abdominal aorta: Not well visualized. Other findings: None. IMPRESSION: The study is limited due to shadowing bowel gas. No cause for sepsis identified. Hepatic steatosis. Electronically Signed   By: Dorise Bullion III M.D   On: 12/08/2017 12:51   Dg Chest Port 1 View  Result Date: 12/26/2017 CLINICAL DATA:  Respiratory failure EXAM: PORTABLE CHEST 1 VIEW COMPARISON:  12/25/2017 FINDINGS: Endotracheal tube and nasogastric catheter are noted in satisfactory position. Cardiac shadow is stable. Right-sided PICC line is noted at the cavoatrial junction. Increased right basilar atelectasis is noted. No bony abnormality is noted. IMPRESSION: Increased right basilar atelectasis. Tubes and lines as described. Electronically Signed   By: Inez Catalina M.D.   On: 12/26/2017 09:31   Dg Chest Port 1 View  Result Date: 12/25/2017 CLINICAL DATA:  COPD, hepatic failure and alcohol withdrawal. Current smoker. Intubated patient. EXAM: PORTABLE CHEST 1 VIEW COMPARISON:  Chest x-ray of December 24, 2017 FINDINGS: The left lung is well-expanded. There is minimal atelectasis or scarring at the left  base. On the right there is mild volume loss with persistent increased density largely obscuring the hemidiaphragm. There is a trace of pleural fluid on the right. There is no pneumothorax. The endotracheal tube tip projects 2.9 cm above the carina. The esophagogastric tube tip and proximal port project below the GE junction. The right-sided PICC line tip projects over the distal third of the SVC. IMPRESSION: Persistent increased density at the right lung base may reflect atelectasis or pneumonia. Probable trace right pleural effusion. The support tubes are in reasonable position. Electronically Signed   By: David  Martinique M.D.   On: 12/25/2017 10:23   Dg Chest Port 1 View  Result Date: 12/07/2017 CLINICAL DATA:  57 y/o  M; PICC line placement. EXAM: PORTABLE CHEST 1 VIEW COMPARISON:  12/29/2017 chest radiograph. FINDINGS: Endotracheal tube 1.7 cm above the carina. Enteric tube tip in gastric body. Right PICC line tip projects over right atrium mild reticular opacities of the lungs are stable. No focal  consolidation. No pleural effusion or pneumothorax identified. Bones are unremarkable. IMPRESSION: Right PICC line tip projects over right atrium. Stable mild reticular opacities of lungs, possibly vascular congestion. No consolidation. Electronically Signed   By: Kristine Garbe M.D.   On: 12/31/2017 18:03   [place] Ekg Site Rite  Result Date: 12/19/2017 If Site Rite image not attached, placement could not be confirmed due to current cardiac rhythm.  Medications: I have reviewed the patient's current medications. Scheduled Meds: . chlorhexidine gluconate (MEDLINE KIT)  15 mL Mouth Rinse BID  . hydrocortisone sod succinate (SOLU-CORTEF) inj  50 mg Intravenous Q12H  . lactulose  30 g Per Tube TID  . mouth rinse  15 mL Mouth Rinse QID  . multivitamin  15 mL Oral Daily  . pantoprazole  40 mg Intravenous Q12H  . pentoxifylline  400 mg Oral Q breakfast  . rifaximin  400 mg Per Tube TID   Continuous  Infusions: . sodium chloride 50 mL/hr at 12/26/17 0642  . ceFEPime (MAXIPIME) IV Stopped (12/26/17 0442)  . dexmedetomidine (PRECEDEX) IV infusion 1.2 mcg/kg/hr (12/26/17 1017)  . norepinephrine (LEVOPHED) Adult infusion 6 mcg/min (12/26/17 0520)  . potassium chloride 10 mEq (12/26/17 1118)  . sodium chloride    . thiamine injection Stopped (12/26/17 1051)  . vasopressin (PITRESSIN) infusion - *FOR SHOCK* 0.03 Units/min (12/25/17 2049)   PRN Meds:.bisacodyl, fentaNYL (SUBLIMAZE) injection, ipratropium-albuterol, LORazepam, sodium chloride flush  Body mass index is 31.35 kg/m.  Assessment: Active Problems:   Liver failure (HCC)  Jack Lowe a 57 y.o.y/o male AKI, acute alcoholic hepatitis, long standing use of NSAIDs, severe hyponatremia, S/p respiratory arrest while in hospital , intubated. Presently on pressors . On pentoxyfilline for Alcoholic hepatitis with renal failure . ?respiratory failure from COPD/infection. Albumin 1.7 .    Plan   1. F/u acute hepatitis A/B/C/HSV/CMV,EBV,VZV 2. Replace electrolytes 3 .IV thiamine to prevent wernickes encephelopathy 6. 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 Since he is critically ill suggest continue nor epinephrine in combination with albumin 1g/kg on day one  And day 2( 100 grams each day for two days ) 7. IV PPI . 8.. Overall very high mortality rate associated with his conditions. 9. Distended abdomen from ileus- replace electrolytes, serial abdominal exam - if not having a bowel movement give enemas with lactulose.     LOS: 2 days   Jack Bellows, MD 12/26/2017, 11:25 AM

## 2017-12-26 NOTE — Consult Note (Deleted)
PULMONARY / CRITICAL CARE MEDICINE   Name: Jack Lowe MRN: [id number] DOB: [birth year]    ADMISSION DATE:  12/09/2017 CONSULTATION DATE:  04/23  PT PROFILE:   45 M with history of alcohol abuse, alcoholic hepatitis, delirium tremens, schizophrenia brought to ED 04/23 by family for AMS, profound jaundice.  Found to be profoundly hyponatremic (Na 106).  Admitted to ICU/SDU.  Suffered apneic respiratory arrest in radiology prior to abdominal ultrasound.  MAJOR EVENTS/TEST RESULTS: 04/23 admission as documented above 04/23 suffered respiratory arrest in radiology.  Intubated 04/23 CT head: No acute findings 04/23 CTAP: Fatty liver.  Otherwise, no acute findings 04/23 Abd [place]: The study is limited due to shadowing bowel gas. No cause for sepsis identified. Hepatic steatosis  04/23 Hypotension > norepinephrine initiated 04/24 Requiring high dose norepi (36 mcg/min). Vasopressin initiated. Remains on hypertonic saline. Na gradually improving. Requiring sedation. High Ve prohibits weaning. Vent changes made. Oliguric (Uo 280cc/24 hrs). Propofol changed to dex gtt + intermittent lorazepam + intermittent fentanyl.  Hypertonic saline discontinued for sodium 136 (which was probably a spurious value) 04/25 intermittently agitated requiring dexmedetomidine infusion.  Tolerates PSV 10 cm H2O.  Worsening abdominal distention.  KUB consistent with ileus pattern.  Albumin initiated by gastroenterology.  Vasopressor requirements improving.  INDWELLING DEVICES:: ETT 04/23 >>  RUE PICC 04/23 >>   MICRO DATA: MRSA PCR 04/23 >> NEG  Blood 04/23 >>  Resp 04/24 >> Pseudomonas  ANTIMICROBIALS:  Ceftriaxone 04/23 >> 04/24 Cefepime 04/24 >>   SUBJECTIVE:  Intermittently agitated requiring dexmedetomidine infusion.  Tolerates PSV 10 cm H2O.  Worsening abdominal distention.  KUB consistent with ileus pattern.  Albumin initiated by gastroenterology.  Vasopressor requirements improving.  VITAL SIGNS: BP  (!) 98/54   Pulse 75   Temp 98.8 F (37.1 C) (Axillary)   Resp (!) 21   Ht 5\' 10"  (1.778 m)   Wt 218 lb 7.6 oz (99.1 kg)   SpO2 98%   BMI 31.35 kg/m   HEMODYNAMICS:    VENTILATOR SETTINGS: Vent Mode: PSV FiO2 (%):  [35 %] 35 % Set Rate:  [15 bmp] 15 bmp Vt Set:  [600 mL] 600 mL PEEP:  [5 cmH20] 5 cmH20 Pressure Support:  [10 cmH20] 10 cmH20  INTAKE / OUTPUT: I/O last 3 completed shifts: In: 5802.5 [I.V.:4222.5; NG/GT:180; IV Piggyback:1400] Out: 1375 [Urine:1300; Emesis/NG output:75]  PHYSICAL EXAMINATION: General: Profoundly icteric, intubated, sedate, not F/C Neuro: Cranial nerves intact, moves all extremities HEENT: NCAT, severe sclericterus Cardiovascular: Regular, no murmur Lungs: Clear anteriorly Abdomen: Markedly distended, diffuse tympany, absent BS Ext: Warm, no edema Skin: Severe jaundice  LABS:  BMET Recent Labs  Lab 12/25/17 2113  12/26/17 0423 12/26/17 4098 12/26/17 0836 12/26/17 0933  NA 122*   < > TEST WILL BE CREDITED 123* 122*  --   K 3.1*  --  TEST WILL BE CREDITED 3.4*  --   --   CL 96*  --  TEST WILL BE CREDITED 99*  --   --   CO2 13*  --  TEST WILL BE CREDITED 16*  --   --   BUN 19  --  TEST WILL BE CREDITED 19  --   --   CREATININE 1.66*  --  TEST WILL BE CREDITED UNABLE TO REPORT DUE TO ICTERUS  --   UNABLE TO REPORT DUE TO ICTERIC INTERFERENCE SDR  GLUCOSE 180*  --  TEST WILL BE CREDITED 142*  --   --    < > = values  in this interval not displayed.    Electrolytes Recent Labs  Lab Jan 18, 2018 0050  12/25/17 2113 12/26/17 0423 12/26/17 1610  CALCIUM 7.4*   < > 6.6* TEST WILL BE CREDITED 7.1*  MG 1.4*  --   --  TEST WILL BE CREDITED 1.9  PHOS UNABLE TO REPORT DUE TO ICTERUS  --   --  TEST WILL BE CREDITED UNABLE TO REPORT DUE TO ICTERUS   < > = values in this interval not displayed.    CBC Recent Labs  Lab Jan 18, 2018 0050 12/25/17 0500 12/26/17 0423  WBC 22.1* 19.3* 21.6*  HGB 9.0* 8.6* 7.7*  HCT 26.4* 25.5* 23.2*  PLT  171 197 152    Coag's Recent Labs  Lab 01/18/18 0050 12/26/17 0423  APTT 43* 38*  INR 1.12 1.25    Sepsis Markers Recent Labs  Lab 01-18-2018 0050 01-18-2018 0522 12/25/17 1222 12/26/17 0423  LATICACIDVEN 2.7* 1.3  --   --   PROCALCITON  --   --  5.70 3.21    ABG Recent Labs  Lab 18-Jan-2018 1118  PHART 7.18*  PCO2ART 51*  PO2ART 70*    Liver Enzymes Recent Labs  Lab 12/25/17 0500 12/26/17 0423 12/26/17 0638  AST 207* TEST WILL BE CREDITED 199*  ALT 69* TEST WILL BE CREDITED 71*  ALKPHOS 705* TEST WILL BE CREDITED 708*  BILITOT 24.4* TEST WILL BE CREDITED 25.0*  ALBUMIN 1.7* TEST WILL BE CREDITED 1.7*    Cardiac Enzymes Recent Labs  Lab 2018-01-18 0050  TROPONINI 0.10*    Glucose Recent Labs  Lab 12/25/17 1954 12/26/17 0000 12/26/17 0357 12/26/17 0731 12/26/17 1131 12/26/17 1555  GLUCAP 125* 151* 140* 139* 149* 141*    CXR: Improved RLL ATX vs infiltrate    ASSESSMENT / PLAN:  NEUROLOGIC A:   Chronic alcohol abuse  Hx of DTs Acute encephalopathy due to hyponatremia Hyperammonemia /hepatic encephalopathy ICU/ventilator associated discomfort P:   RASS goal: 0 Continue dexmedetomidine infusion (initiated 04/24) Cont intermittent fentanyl and lorazepam Cont lactulose and rifaximin  Continue normal saline at current rate Cont high dose thiamine  PULMONARY A: VDRF after respiratory arrest 04/23 Smoker with documented history of COPD  No bronchospasm presently Likely RLL pneumonia P:   Cont vent support - settings reviewed and/or adjusted Wean in PSV mode as tolerated Cont vent bundle Daily SBT if/when meets criteria  Cont PRN nebs Cannot be extubated until cognition improves significantly  CARDIOVASCULAR A:  Shock -likely multifactorial P:  MAP goal >70 mmHg Cont norepinephrine  Vasopressin initiated 04/24, DC'd 04/25 Albumin initiated 04/25 by gastroenterology  RENAL A:   Profound hyponatremia, improving Mild metabolic  acidosis Oliguria resolved AKI, likely improving   (very elevated bilirubin is interfering with Cr assay) Hypokalemia P:   Cont NS Monitor BMET intermittently Monitor I/Os Correct electrolytes as indicated  Recheck BMET this afternoon K+ repleted 04/25  GASTROINTESTINAL A:   Acute hepatic failure Alcoholic hepatitis Ileus P:   SUP: IV PPI Pentoxifylline initiated 04/23 Holding TFs for now  HEMATOLOGIC A:   Macrocytic anemia P:  DVT px: SCDs Monitor CBC intermittently Transfuse per usual guidelines  Cont Folate   INFECTIOUS A:   RLL Pseudomonas pneumonia Elevated PCT P:   Monitor temp, WBC count Micro and abx as above  Following PCT  ENDOCRINE A:   No acute issues P:   Monitor CBGs intermittently   FAMILY: Daughters updated at bedside today.  They understand that his prognosis for survival is poor.  However,  he has improved over the past 24 hours.  I have encouraged that we consider DNR in event of cardiac arrest.  They are reluctant to make that decision at this time.  It is appropriate for us to continue with her current aggressive measures for now.  CCM time: 45 mins The above time includes time spent in consultation with patient and/or family members and reviewing care plan on multidisciplinary rounds  Billy Fischeravid Channel Papandrea, MD PCCM service Mobile [phone] Pager [phone] 12/26/2017, 4:44 PM

## 2017-12-26 NOTE — Progress Notes (Signed)
[employer] - [place] at [facility]   PATIENT NAME: Jack Lowe    MR#:  [id number]  DATE OF BIRTH:  23-Jun-1961  SUBJECTIVE:   Patient intubated and sedated  Pressors are being weaned remains OLIGURIC Hgb lower this am nurse reports no dark stools had yellow small stool  REVIEW OF SYSTEMS:    Unable to obtain patient intubated and sedated on vent   Tolerating Diet: npo      DRUG ALLERGIES:   Allergies  Allergen Reactions  . Haldol [Haloperidol] Swelling    Tongue swelling  . Toradol [Ketorolac Tromethamine] Swelling    VITALS:  Blood pressure 105/61, pulse 66, temperature 98.6 F (37 C), temperature source Axillary, resp. rate 19, height 5\' 10"  (1.778 m), weight 99.1 kg (218 lb 7.6 oz), SpO2 100 %.  PHYSICAL EXAMINATION:  Constitutional: Appears well-developed and well-nourished. No distress. HENT: Normocephalic. Marland Kitchen Patient intubated Eyes: Conjunctivae  are normal. PERRLA, positive scleral icterus.  Neck: Neck supple. No JVD. No tracheal deviation. CVS: RRR, S1/S2 +, no murmurs, no gallops, no carotid bruit.  Pulmonary: Effort and breath sounds normal, no stridor, rhonchi, wheezes, rales.  Abdominal: Distended abdomen with diminished bowel sounds no fluid wave.  Musculoskeletal: Normal range of motion. No edema and no tenderness.  Neuro:sedated on vent Skin: Skin is warm and dry.  Jaundice Psychiatric: Sedated on vent     LABORATORY PANEL:   CBC Recent Labs  Lab 12/26/17 0423  WBC 21.6*  HGB 7.7*  HCT 23.2*  PLT 152   ------------------------------------------------------------------------------------------------------------------  Chemistries  Recent Labs  Lab 12/26/17 0638  NA 123*  K 3.4*  CL 99*  CO2 16*  GLUCOSE 142*  BUN 19  CREATININE UNABLE TO REPORT DUE TO ICTERUS  CALCIUM 7.1*  MG 1.9  AST 199*  ALT 71*  ALKPHOS 708*  BILITOT 25.0*    ------------------------------------------------------------------------------------------------------------------  Cardiac Enzymes Recent Labs  Lab Jan 09, 2018 0050  TROPONINI 0.10*   ------------------------------------------------------------------------------------------------------------------  RADIOLOGY:  Ct Abdomen Pelvis Wo Contrast  Result Date: 2018/01/09 CLINICAL DATA:  Abdominal distension. EXAM: CT ABDOMEN AND PELVIS WITHOUT CONTRAST TECHNIQUE: Multidetector CT imaging of the abdomen and pelvis was performed following the standard protocol without IV contrast. COMPARISON:  None. FINDINGS: Lower chest: Mild bilateral posterior basilar subsegmental atelectasis is noted. Hepatobiliary: No gallstones are noted. Fatty infiltration of the liver is noted. No biliary dilatation is noted. Pancreas: Unremarkable. No pancreatic ductal dilatation or surrounding inflammatory changes. Spleen: Normal in size without focal abnormality. Adrenals/Urinary Tract: Adrenal glands are unremarkable. Kidneys are normal, without renal calculi, focal lesion, or hydronephrosis. Bladder is unremarkable. Stomach/Bowel: Stomach is within normal limits. Appendix appears normal. No evidence of bowel wall thickening, distention, or inflammatory changes. Vascular/Lymphatic: Aortic atherosclerosis. No enlarged abdominal or pelvic lymph nodes. Reproductive: Prostate is unremarkable. Other: No abnormal fluid collection is noted. Small fat containing right inguinal hernia is noted. Musculoskeletal: No acute or significant osseous findings. IMPRESSION: Fatty infiltration of the liver. No other significant abnormality seen in the abdomen or pelvis. Electronically Signed   By: Lupita Raider, M.D.   On: 01-09-18 10:26   Dg Abd 1 View  Result Date: 09-Jan-2018 CLINICAL DATA:  Nasogastric tube placement. EXAM: ABDOMEN - 1 VIEW COMPARISON:  Radiographs of September 23, 2017. FINDINGS: The bowel gas pattern is normal. Distal tip  of nasogastric tube is seen in proximal stomach. No radio-opaque calculi or other significant radiographic abnormality are seen. IMPRESSION: Distal tip of nasogastric tube seen in proximal stomach. No definite  evidence of bowel obstruction. Electronically Signed   By: Lupita Raider, M.D.   On: 12/11/2017 12:50   Ct Head Wo Contrast  Result Date: 12/11/2017 CLINICAL DATA:  Status post cardiac arrest with altered mental status EXAM: CT HEAD WITHOUT CONTRAST TECHNIQUE: Contiguous axial images were obtained from the base of the skull through the vertex without intravenous contrast. COMPARISON:  None. FINDINGS: Brain: The ventricles are normal in size and configuration. There is no intracranial mass, hemorrhage, extra-axial fluid collection, or midline shift. The gray-white compartments appear normal. No evident acute infarct. Vascular: No hyperdense vessel. No appreciable vascular calcification evident. Skull: The bony calvarium appears intact. Sinuses/Orbits: There is patchy opacity in several ethmoid air cells. There is slight mucosal thickening in the anterior sphenoid sinus regions. Other visualized paranasal sinuses are clear. Frontal sinuses are somewhat hypoplastic. Orbits appear symmetric bilaterally. Other: Mastoid air cells are clear. IMPRESSION: No mass or hemorrhage. Gray-white compartments appear normal. Mild paranasal sinus disease noted. Electronically Signed   By: Bretta Bang III M.D.   On: 12/04/2017 10:16   US Abdomen Complete  Result Date: 12/18/2017 CLINICAL DATA:  Sepsis EXAM: ABDOMEN ULTRASOUND COMPLETE COMPARISON:  None. FINDINGS: Gallbladder: No gallstones or wall thickening visualized. No sonographic Murphy sign noted by sonographer. Common bile duct: Diameter: 3.3 mm Liver: Hepatic steatosis. No focal mass. Portal vein is patent on color Doppler imaging with normal direction of blood flow towards the liver. IVC: No abnormality visualized. Pancreas: The pancreas was not  visualized. Spleen: Size and appearance within normal limits. Right Kidney: Length: 14.1 cm. Echogenicity within normal limits. No mass or hydronephrosis visualized. Left Kidney: Length: 13.6 cm. Echogenicity within normal limits. No mass or hydronephrosis visualized. Abdominal aorta: Not well visualized. Other findings: None. IMPRESSION: The study is limited due to shadowing bowel gas. No cause for sepsis identified. Hepatic steatosis. Electronically Signed   By: Gerome Sam III M.D   On: 12/30/2017 12:51   Dg Chest Port 1 View  Result Date: 12/25/2017 CLINICAL DATA:  COPD, hepatic failure and alcohol withdrawal. Current smoker. Intubated patient. EXAM: PORTABLE CHEST 1 VIEW COMPARISON:  Chest x-ray of December 24, 2017 FINDINGS: The left lung is well-expanded. There is minimal atelectasis or scarring at the left base. On the right there is mild volume loss with persistent increased density largely obscuring the hemidiaphragm. There is a trace of pleural fluid on the right. There is no pneumothorax. The endotracheal tube tip projects 2.9 cm above the carina. The esophagogastric tube tip and proximal port project below the GE junction. The right-sided PICC line tip projects over the distal third of the SVC. IMPRESSION: Persistent increased density at the right lung base may reflect atelectasis or pneumonia. Probable trace right pleural effusion. The support tubes are in reasonable position. Electronically Signed   By: David  [place] M.D.   On: 12/25/2017 10:23   Dg Chest Port 1 View  Result Date: 12/05/2017 CLINICAL DATA:  57 y/o  M; PICC line placement. EXAM: PORTABLE CHEST 1 VIEW COMPARISON:  12/30/2017 chest radiograph. FINDINGS: Endotracheal tube 1.7 cm above the carina. Enteric tube tip in gastric body. Right PICC line tip projects over right atrium mild reticular opacities of the lungs are stable. No focal consolidation. No pleural effusion or pneumothorax identified. Bones are unremarkable.  IMPRESSION: Right PICC line tip projects over right atrium. Stable mild reticular opacities of lungs, possibly vascular congestion. No consolidation. Electronically Signed   By: Mitzi Hansen M.D.   On: 12/07/2017 18:03  Dg Chest Port 1 View  Result Date: December 14, 2017 CLINICAL DATA:  Status post intubation. EXAM: PORTABLE CHEST 1 VIEW COMPARISON:  Radiographs of September 23, 2017. FINDINGS: Stable cardiomediastinal silhouette. Endotracheal tube is seen projected over tracheal air shadow with distal tip 5 cm above the carina. No pneumothorax or pleural effusion is noted. No acute pulmonary disease is noted. Bony thorax is unremarkable. IMPRESSION: Endotracheal tube in grossly good position. No acute cardiopulmonary abnormality seen. Electronically Signed   By: Lupita RaiderJames  Green Jr, M.D.   On: 0April 13, 2019 10:05   [place] Ekg Site Rite  Result Date: December 14, 2017 If Site Rite image not attached, placement could not be confirmed due to current cardiac rhythm.  [place] Ekg Site Rite  Result Date: December 14, 2017 If Site Rite image not attached, placement could not be confirmed due to current cardiac rhythm.    ASSESSMENT AND PLAN:    57 year old male with history of EtOH abuse who is brought to the ER due to altered mental status.   1.  Acute hypoxic respiratory failure due to respiratory arrest pneumonia: Vent management as per intensivist Continue cefepime and vancomycin  2.  Acute metabolic encephalopathy in the setting of EtOH abuse and profound hyponatremia Continue lactulose and Xifaxan Monitor sodium levels  Patient on N S for hyponatremia and being followed by Nephrology  3.  Hypotension after intubation due to sedation: Keep MAP>70 Weaning pressors this am Wean hydrocortisone as tolerated 4.  Hypokalemia: Repleted   5.  Acute alcoholic hepatitis: GI consultation appreciated Continue Trental IV thiamine to prevent Warnicke's encephalopathy as per GI recommendations Abdominal ultrasound  did not show profound amount of ascites  6.  EtOH withdrawal: Currently sedated on ventilator  7.  Acute kidney injury: Patient may be developing hepatorenal syndrome Low threshold for CRRT as per nephrology consultation. Hold nephrotoxic medications May need Midrin, octreotide and albumin infusion as per GI due to hepatorenal syndrome  8.  Acute anemia on chronic disease: Continue PPI Patient would benefit from 1 unit PRBC GI following    Management plans discussed with the nursing CODE STATUS: FULL  TOTAL TIME TAKING CARE OF THIS PATIENT: 24 minutes.     POSSIBLE D/C 4-6 days, DEPENDING ON CLINICAL CONDITION.   Gayatri Teasdale M.D on 12/26/2017 at 7:58 AM  Between 7am to 6pm - Pager - [phone] After 6pm go to [email or web address] - password [email or web address]  [facility]  Office  [phone]  CC: Primary care physician; System, Pcp Not In  Note: This dictation was prepared with Dragon dictation along with smaller phrase technology. Any transcriptional errors that result from this process are unintentional.

## 2017-12-27 ENCOUNTER — Inpatient Hospital Stay: Payer: Medicaid - Out of State

## 2017-12-27 DIAGNOSIS — R6521 Severe sepsis with septic shock: Secondary | ICD-10-CM

## 2017-12-27 DIAGNOSIS — K559 Vascular disorder of intestine, unspecified: Secondary | ICD-10-CM

## 2017-12-27 DIAGNOSIS — K7031 Alcoholic cirrhosis of liver with ascites: Secondary | ICD-10-CM

## 2017-12-27 DIAGNOSIS — J151 Pneumonia due to Pseudomonas: Secondary | ICD-10-CM

## 2017-12-27 DIAGNOSIS — R140 Abdominal distension (gaseous): Secondary | ICD-10-CM

## 2017-12-27 LAB — GLUCOSE, CAPILLARY
Glucose-Capillary: 84 mg/dL (ref 65–99)
Glucose-Capillary: 84 mg/dL (ref 65–99)
Glucose-Capillary: 85 mg/dL (ref 65–99)
Glucose-Capillary: 59 mg/dL — ABNORMAL LOW (ref 65–99)

## 2017-12-27 LAB — COMPREHENSIVE METABOLIC PANEL
ALBUMIN: 2.6 g/dL — AB (ref 3.5–5.0)
ALK PHOS: 463 U/L — AB (ref 38–126)
ALT: 58 U/L (ref 17–63)
AST: 185 U/L — ABNORMAL HIGH (ref 15–41)
Anion gap: 10 (ref 5–15)
BUN: 20 mg/dL (ref 6–20)
CALCIUM: 7 mg/dL — AB (ref 8.9–10.3)
CO2: 12 mmol/L — ABNORMAL LOW (ref 22–32)
CREATININE: 1.71 mg/dL — AB (ref 0.61–1.24)
Chloride: 105 mmol/L (ref 101–111)
GFR calc Af Amer: 51 mL/min — ABNORMAL LOW (ref >60)
GFR calc non Af Amer: 44 mL/min — ABNORMAL LOW (ref >60)
GLUCOSE: 166 mg/dL — AB (ref 65–99)
Potassium: 3.5 mmol/L (ref 3.5–5.1)
SODIUM: 127 mmol/L — AB (ref 135–145)
Total Bilirubin: 27.9 mg/dL (ref 0.3–1.2)
Total Protein: 5.1 g/dL — ABNORMAL LOW (ref 6.5–8.1)

## 2017-12-27 LAB — CBC
HCT: 22.6 % — ABNORMAL LOW (ref 40.0–52.0)
HEMOGLOBIN: 7.2 g/dL — AB (ref 13.0–18.0)
MCH: 36.6 pg — ABNORMAL HIGH (ref 26.0–34.0)
MCHC: 31.8 g/dL — ABNORMAL LOW (ref 32.0–36.0)
MCV: 115.1 fL — ABNORMAL HIGH (ref 80.0–100.0)
Platelets: 85 10*3/uL — ABNORMAL LOW (ref 150–440)
RBC: 1.96 MIL/uL — AB (ref 4.40–5.90)
RDW: 18.4 % — ABNORMAL HIGH (ref 11.5–14.5)
WBC: 36.2 10*3/uL — ABNORMAL HIGH (ref 3.8–10.6)

## 2017-12-27 LAB — BLOOD GAS, ARTERIAL
Acid-base deficit: 16.4 mmol/L — ABNORMAL HIGH (ref 0.0–2.0)
BICARBONATE: 11.1 mmol/L — AB (ref 20.0–28.0)
FIO2: 0.35
MECHANICAL RATE: 15
MECHVT: 600 mL
O2 Saturation: 77.1 %
PCO2 ART: 32 mmHg (ref 32.0–48.0)
PEEP/CPAP: 5 cmH2O
PO2 ART: 55 mmHg — AB (ref 83.0–108.0)
Patient temperature: 37
pH, Arterial: 7.15 — CL (ref 7.350–7.450)

## 2017-12-27 LAB — CULTURE, RESPIRATORY

## 2017-12-27 LAB — CULTURE, RESPIRATORY W GRAM STAIN

## 2017-12-27 LAB — HERPES SIMPLEX VIRUS(HSV) DNA BY PCR
HSV 1 DNA: NEGATIVE
HSV 2 DNA: NEGATIVE

## 2017-12-27 LAB — HEPATITIS B E ANTIBODY: HEP B E AB: NEGATIVE

## 2017-12-27 LAB — HEPATITIS C VRS RNA DETECT BY PCR-QUAL: HEPATITIS C VRS RNA BY PCR-QUAL: NEGATIVE

## 2017-12-27 LAB — PROCALCITONIN: PROCALCITONIN: 19.1 ng/mL

## 2017-12-27 LAB — LACTIC ACID, PLASMA: LACTIC ACID, VENOUS: 3.7 mmol/L — AB (ref 0.5–1.9)

## 2017-12-27 MED ORDER — GLYCOPYRROLATE 0.2 MG/ML IJ SOLN
0.2000 mg | INTRAMUSCULAR | Status: DC | PRN
Start: 2017-12-27 — End: 2017-12-27

## 2017-12-27 MED ORDER — POTASSIUM CHLORIDE 10 MEQ/50ML IV SOLN
10.0000 meq | INTRAVENOUS | Status: DC
  Administered 2017-12-27 (×3): 10 meq via INTRAVENOUS
  Filled 2017-12-27 (×4): qty 50

## 2017-12-27 MED ORDER — PHENYLEPHRINE HCL 10 MG/ML IJ SOLN
0.0000 ug/min | INTRAMUSCULAR | Status: DC
  Administered 2017-12-27: 20 ug/min via INTRAVENOUS
  Administered 2017-12-27: 130 ug/min via INTRAVENOUS
  Filled 2017-12-27 (×2): qty 4

## 2017-12-27 MED ORDER — SODIUM BICARBONATE 8.4 % IV SOLN
100.0000 meq | Freq: Once | INTRAVENOUS | Status: AC
  Administered 2017-12-27: 100 meq via INTRAVENOUS
  Filled 2017-12-27: qty 50

## 2017-12-27 MED ORDER — VASOPRESSIN 20 UNIT/ML IV SOLN
0.0300 [IU]/min | INTRAVENOUS | Status: DC
  Administered 2017-12-27: 0.03 [IU]/min via INTRAVENOUS
  Filled 2017-12-27: qty 2

## 2017-12-27 MED ORDER — STERILE WATER FOR INJECTION IJ SOLN
INTRAMUSCULAR | Status: AC
  Administered 2017-12-27: 07:00:00
  Filled 2017-12-27: qty 10

## 2017-12-27 MED ORDER — FLEET ENEMA 7-19 GM/118ML RE ENEM
1.0000 | ENEMA | Freq: Once | RECTAL | Status: AC
  Administered 2017-12-27: 1 via RECTAL

## 2017-12-27 MED ORDER — GLYCOPYRROLATE 0.2 MG/ML IJ SOLN: 0.2000 mg | INTRAMUSCULAR | Status: DC | PRN

## 2017-12-27 MED ORDER — FENTANYL 2500MCG IN NS 250ML (10MCG/ML) PREMIX INFUSION
0.0000 ug/h | INTRAVENOUS | Status: DC
  Administered 2017-12-27: 25 ug/h via INTRAVENOUS
  Administered 2017-12-27: 200 ug/h via INTRAVENOUS
  Filled 2017-12-27 (×2): qty 250

## 2017-12-27 MED ORDER — IOPAMIDOL (ISOVUE-300) INJECTION 61%: 15.0000 mL | INTRAVENOUS | Status: DC

## 2017-12-27 MED ORDER — VECURONIUM BROMIDE 10 MG IV SOLR
10.0000 mg | Freq: Once | INTRAVENOUS | Status: AC
  Administered 2017-12-27: 10 mg via INTRAVENOUS
  Filled 2017-12-27: qty 10

## 2017-12-27 MED ORDER — MORPHINE SULFATE (PF) 2 MG/ML IV SOLN: 2.0000 mg | INTRAVENOUS | Status: DC | PRN

## 2017-12-27 MED ORDER — SODIUM BICARBONATE 8.4 % IV SOLN
INTRAVENOUS | Status: DC
  Administered 2017-12-27: 06:00:00 via INTRAVENOUS
  Filled 2017-12-27 (×3): qty 150

## 2017-12-27 MED ORDER — GLYCOPYRROLATE 1 MG PO TABS: 1.0000 mg | ORAL_TABLET | ORAL | Status: DC | PRN

## 2017-12-27 MED ORDER — DEXTROSE 50 % IV SOLN
1.0000 | Freq: Once | INTRAVENOUS | Status: AC
  Administered 2017-12-27: 50 mL via INTRAVENOUS
  Filled 2017-12-27: qty 50

## 2017-12-27 MED ORDER — SODIUM BICARBONATE 8.4 % IV SOLN
50.0000 meq | Freq: Once | INTRAVENOUS | Status: AC
  Administered 2017-12-27: 50 meq via INTRAVENOUS
  Filled 2017-12-27: qty 50

## 2017-12-28 LAB — CMV DNA, QUANTITATIVE, PCR
CMV DNA Quant: NEGATIVE IU/mL
Log10 CMV Qn DNA Pl: UNDETERMINED log10 IU/mL

## 2017-12-29 LAB — CULTURE, BLOOD (ROUTINE X 2)
Culture: NO GROWTH
Culture: NO GROWTH

## 2017-12-30 ENCOUNTER — Telehealth: Payer: Self-pay | Admitting: *Deleted

## 2017-12-30 LAB — VARICELLA-ZOSTER BY PCR: VARICELLA-ZOSTER, PCR: NEGATIVE

## 2017-12-30 NOTE — Telephone Encounter (Signed)
Death certificate received via fax this afternoon. Placed on in MD folder for completion.

## 2017-12-31 NOTE — Telephone Encounter (Signed)
[employer] calling to check status of signing death certificate

## 2017-12-31 NOTE — Telephone Encounter (Signed)
Death cert faxed to [facility] per their request to [phone].

## 2018-01-01 NOTE — Progress Notes (Signed)
Comfort care,extubated without complications 

## 2018-01-01 NOTE — Progress Notes (Signed)
   12/15/2017 1245  Clinical Encounter Type  Visited With Family  Visit Type Initial;Other (Comment)  Referral From Nurse  Consult/Referral To Chaplain  Spiritual Encounters  Spiritual Needs Prayer;Emotional;Grief support   CH received a PG to come and support DR as she informed family of PT's status. CH provided emotional and grief support. CH will follow up as needed.

## 2018-01-01 NOTE — Progress Notes (Signed)
Jack Lowe , MD [address], [place], [place], [place], [address] [address], [employer], [place], [place], [address] Phone: [phone]  Fax: [phone]   Jack Lowe is being followed for acute alcoholic hepatitis , hepatorenal syndrome   Subjective: Unable to provide history    Objective: Vital signs in last 24 hours: Vitals:   01/01/18 1115 Jan 01, 2018 1130 Jan 01, 2018 1200 01/01/18 1215  BP:  (!) 91/54 (!) 82/70 (!) 96/56  Pulse:  95 95 96  Resp:  17 15 15   Temp:      TempSrc:      SpO2: 91% 90% 91% 91%  Weight:      Height:       Weight change: 3 lb 1.4 oz (1.4 kg)  Intake/Output Summary (Last 24 hours) at 01-Jan-2018 1226 Last data filed at 01/01/2018 1148 Gross per 24 hour  Intake 2792.45 ml  Output 440 ml  Net 2352.45 ml     Exam: Heart:: Regular rate and rhythm, S1S2 present or without murmur or extra heart sounds Lungs: normal, clear to auscultation and clear to auscultation and percussion Abdomen: abdomen significantly distended, tense, tympanic on percussion, no bs , no guarding or rigidity   Lab Results: @LABTEST2 @ Micro Results: Recent Results (from the past 240 hour(s))  Blood culture (routine x 2)     Status: None (Preliminary result)   Collection Time: 12/03/2017  2:52 AM  Result Value Ref Range Status   Specimen Description BLOOD LEFT HAND  Final   Special Requests   Final    BOTTLES DRAWN AEROBIC AND ANAEROBIC Blood Culture results may not be optimal due to an excessive volume of blood received in culture bottles   Culture   Final    NO GROWTH 3 DAYS Performed at [facility], [address] [place], [place] [address]    Report Status PENDING  Incomplete  Blood culture (routine x 2)     Status: None (Preliminary result)   Collection Time: 12/29/2017  2:52 AM  Result Value Ref Range Status   Specimen Description BLOOD BLOOD RIGHT WRIST  Final   Special Requests   Final    BOTTLES DRAWN AEROBIC AND ANAEROBIC Blood Culture  results may not be optimal due to an excessive volume of blood received in culture bottles   Culture   Final    NO GROWTH 3 DAYS Performed at [facility], [address], [place], [place] [address]    Report Status PENDING  Incomplete  MRSA PCR Screening     Status: None   Collection Time: 12/18/2017 11:27 AM  Result Value Ref Range Status   MRSA by PCR NEGATIVE NEGATIVE Final    Comment:        The GeneXpert MRSA Assay (FDA approved for NASAL specimens only), is one component of a comprehensive MRSA colonization surveillance program. It is not intended to diagnose MRSA infection nor to guide or monitor treatment for MRSA infections. Performed at [facility], [address] [place], [place] [address]   Culture, respiratory (NON-Expectorated)     Status: None (Preliminary result)   Collection Time: 12/25/17 11:54 AM  Result Value Ref Range Status   Specimen Description   Final    TRACHEAL ASPIRATE Performed at [facility], [address], [place], [place] [address]    Special Requests   Final    NONE Performed at [facility], [address], [place], [place] [address]    Gram Stain  Final    RARE WBC PRESENT, PREDOMINANTLY PMN MODERATE GRAM NEGATIVE RODS Performed at [facility], [place] [address], [place], [place] [address]    Culture MODERATE PSEUDOMONAS AERUGINOSA  Final   Report Status PENDING  Incomplete   Organism ID, Bacteria PSEUDOMONAS AERUGINOSA  Final      Susceptibility   Pseudomonas aeruginosa - MIC*    CEFTAZIDIME 2 SENSITIVE Sensitive     CIPROFLOXACIN <=0.25 SENSITIVE Sensitive     GENTAMICIN <=1 SENSITIVE Sensitive     IMIPENEM 1 SENSITIVE Sensitive     PIP/TAZO <=4 SENSITIVE Sensitive     CEFEPIME <=1 SENSITIVE Sensitive     * MODERATE PSEUDOMONAS AERUGINOSA   Studies/Results: Ct Abdomen Pelvis Wo Contrast  Result Date: Jan 19, 2018 CLINICAL DATA:  Abdominal distention.  Jaundice. EXAM: CT ABDOMEN AND  PELVIS WITHOUT CONTRAST TECHNIQUE: Multidetector CT imaging of the abdomen and pelvis was performed following the standard protocol without IV contrast. COMPARISON:  12/08/2017 FINDINGS: Lower chest: Heart is enlarged. Distal tip of a right PICC line is positioned at the SVC/RA junction. Right lower lobe collapse/consolidation is progressive in the interval. Small bilateral pleural effusions are noted and there is new patchy airspace disease in the left base. Hepatobiliary: Liver measures 24.9 cm craniocaudal length. The liver shows diffusely decreased attenuation suggesting steatosis. Probable sludge within the gallbladder. No portal venous gas. No intrahepatic or extrahepatic biliary dilation. Pancreas: No focal mass lesion. No dilatation of the main duct. No intraparenchymal cyst. No peripancreatic edema. Spleen: No splenomegaly. No focal mass lesion. Adrenals/Urinary Tract: No adrenal nodule or mass. Kidneys unremarkable. No evidence for hydroureter. Foley catheter decompresses the urinary bladder. Stomach/Bowel: NG tube tip is in the distal stomach Duodenum is normally positioned as is the ligament of Treitz. Small bowel is mildly distended and fluid-filled measuring up to about 3.8 cm diameter. Gas is identified within the wall the terminal ileum. Interval development of circumferential pneumatosis in the cecum and ascending colon up to about the level of the hepatic flexure. There is associated edema/inflammation around the right colon with trace fluid in the right paracolic gutter. Transverse colon is diffusely gas filled without wall thickening or pneumatosis. Left colon is decompressed and unremarkable by CT. Vascular/Lymphatic: No abdominal aortic aneurysm. Mild atherosclerotic calcification is noted in the abdominal aorta with no substantial calcific plaque at the origin of the celiac axis, SMA, or IMA. There is no gastrohepatic or hepatoduodenal ligament lymphadenopathy. No intraperitoneal or  retroperitoneal lymphadenopathy. No pelvic sidewall lymphadenopathy. Reproductive: The prostate gland and seminal vesicles have normal imaging features. Other: Trace intraperitoneal free fluid evident. Musculoskeletal: Bone windows reveal no worrisome lytic or sclerotic osseous lesions. IMPRESSION: 1. Mild diffuse distention of small bowel with pneumatosis identified in the terminal ileum and right colon. There is around the right colon with some minimal fluid in the right paracolic gutter. Imaging features highly concerning for bowel ischemia. No evidence for portal venous gas. 2. Bilateral patchy areas of collapse/consolidation in both lungs, progressive in the interval with small bilateral pleural effusions. Imaging features suggest multifocal with steatosis. 3. Hepatomegaly with hepatic steatosis. Critical Value/emergent results were called by me at the time of interpretation on 01/19/2018 at 11:32 am to Dr. Cammie Sickle , who verbally acknowledged these results. Electronically Signed   By: Misty Stanley M.D.   On: 01/19/2018 11:32   Dg Abd 1 View  Result Date: 01-19-2018 CLINICAL DATA:  Abdominal distension. EXAM: ABDOMEN - 1 VIEW COMPARISON:  12/26/2017 FINDINGS: Nasogastric tube tip is  at the gastric antrum. Diffuse gaseous distension of the small intestine again seen. Large amount of gas and fecal matter also present within the colon. Peripheral gas distribution in the right colon could either be intraluminal or indicate pneumatosis. IMPRESSION: Increase gaseous distension of the small intestine diffusely despite the presence of a nasogastric tube. Gas and fecal matter present within the colon as well. Peripheral gas distribution pattern in the right colon could be intraluminal or indicate pneumatosis. Consider CT if there is clinical concern about the possibility of bowel infarction. Electronically Signed   By: Nelson Chimes M.D.   On: Jan 25, 2018 07:46   Dg Abd 1 View  Result Date:  12/26/2017 CLINICAL DATA:  Abdominal distention EXAM: ABDOMEN - 1 VIEW COMPARISON:  12/05/2017 FINDINGS: NG tube is in the stomach. Diffuse gaseous distention of the colon and lower abdominal small bowel loops. Small bowel distention is decreased with continued or slightly worsening gaseous distention of the colon. Favor ileus. No free air organomegaly. IMPRESSION: Gaseous distention of distal small bowel loops and colon, most compatible with ileus. Electronically Signed   By: Rolm Baptise M.D.   On: 12/26/2017 10:22   Dg Chest Port 1 View  Result Date: 01/25/18 CLINICAL DATA:  Abdominal distension. EXAM: PORTABLE CHEST 1 VIEW COMPARISON:  12/26/2017 FINDINGS: Endotracheal tube tip is 2 cm above the carina. Orogastric tube enters the abdomen. Right arm PICC tip is in the proximal right atrium. Slight worsening of edema pattern. Worsened basilar volume loss and/or infiltrate, particularly on the right. IMPRESSION: Worsened volume loss and/or infiltrate, particularly on the right. Mild edema pattern. Electronically Signed   By: Nelson Chimes M.D.   On: 25-Jan-2018 07:44   Dg Chest Port 1 View  Result Date: 12/26/2017 CLINICAL DATA:  Respiratory failure EXAM: PORTABLE CHEST 1 VIEW COMPARISON:  12/25/2017 FINDINGS: Endotracheal tube and nasogastric catheter are noted in satisfactory position. Cardiac shadow is stable. Right-sided PICC line is noted at the cavoatrial junction. Increased right basilar atelectasis is noted. No bony abnormality is noted. IMPRESSION: Increased right basilar atelectasis. Tubes and lines as described. Electronically Signed   By: Inez Catalina M.D.   On: 12/26/2017 09:31   Medications: I have reviewed the patient's current medications. Scheduled Meds: . chlorhexidine gluconate (MEDLINE KIT)  15 mL Mouth Rinse BID  . hydrocortisone sod succinate (SOLU-CORTEF) inj  50 mg Intravenous Q12H  . lactulose  30 g Per Tube TID  . mouth rinse  15 mL Mouth Rinse QID  . multivitamin  15 mL  Oral Daily  . pantoprazole  40 mg Intravenous Q12H  . pentoxifylline  400 mg Oral Q breakfast  . rifaximin  400 mg Per Tube TID   Continuous Infusions: . albumin human Stopped (12/26/17 2213)  . ceFEPime (MAXIPIME) IV Stopped (Jan 25, 2018 0459)  . dexmedetomidine (PRECEDEX) IV infusion 1.2 mcg/kg/hr (25-Jan-2018 1122)  . fentaNYL infusion INTRAVENOUS 200 mcg/hr (Jan 25, 2018 1124)  . norepinephrine (LEVOPHED) Adult infusion 40 mcg/min (01/25/2018 0732)  . phenylephrine (NEO-SYNEPHRINE) Adult infusion 130 mcg/min (01-25-18 1210)  . potassium chloride    .  sodium bicarbonate  infusion 1000 mL 75 mL/hr at 25-Jan-2018 0605  . sodium chloride    . thiamine injection Stopped (12/26/17 1051)  . vasopressin (PITRESSIN) infusion - *FOR SHOCK* 0.03 Units/min (2018-01-25 0600)   PRN Meds:.bisacodyl, fentaNYL (SUBLIMAZE) injection, ipratropium-albuterol, midazolam, sodium chloride flush   Assessment: Active Problems:   Liver failure (HCC) Chrishon E McBrideis a 57 y.o.y/o male with acute alcoholic hepatitis, long standing use of  NSAIDs, severe hyponatremia, S/p respiratory arrest while in hospital , intubated. Presently on pressors . On pentoxyfilline for Alcoholic hepatitis with renal failure/hepatorenal syndrome . ?respiratory failure from COPD/infection. Albumin 1.7  Now concerns for bowel ischemia on CT scan  .    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 Today is  day 2 of 2 for IV albumin ( 100 grams each day for two days ) 2. IV PPI . 3.Overall very high mortality rate associated with his conditions. 4. No further GI input at this time. I will sign off.  Please call me if any further GI concerns or questions.  We would like to thank you for the opportunity to participate in the care of Macarthur E Peschke.     LOS: 3 days   Jack Bellows, MD 2018/01/01, 12:26 PM

## 2018-01-01 NOTE — Progress Notes (Signed)
[employer] [facility] [place] [facility]   PATIENT NAME: Jack MiuFreddie Lowe    MR#:  [id number]  DATE OF BIRTH:  05/30/1961  SUBJECTIVE:   Is critically ill.  He is now on 3 pressors.  There is concern for ischemic bowel.  L  REVIEW OF SYSTEMS:    Unable to obtain patient intubated and sedated on vent   Tolerating Diet: npo      DRUG ALLERGIES:   Allergies  Allergen Reactions  . Haldol [Haloperidol] Swelling    Tongue swelling  . Toradol [Ketorolac Tromethamine] Swelling    VITALS:  Blood pressure (!) 92/58, pulse (!) 110, temperature 98.8 F (37.1 C), temperature source Axillary, resp. rate 15, height 5\' 10"  (1.778 m), weight 100.5 kg (221 lb 9 oz), SpO2 94 %.  PHYSICAL EXAMINATION:  Constitutional: Appears well-developed and well-nourished. No distress. HENT: Normocephalic. Marland Kitchen. Patient intubated Eyes: Conjunctivae  are normal. PERRLA, positive scleral icterus.  Neck: Neck supple. No JVD. No tracheal deviation. CVS: RRR, S1/S2 +, no murmurs, no gallops, no carotid bruit.  Pulmonary: Effort and breath sounds normal, no stridor, rhonchi, wheezes, rales.  Abdominal: Distended abdomen with diminished bowel sounds Musculoskeletal: Normal range of motion. No edema and no tenderness.  Neuro:sedated on vent Skin: Skin is warm and dry.  Jaundice Psychiatric: Sedated on vent     LABORATORY PANEL:   CBC Recent Labs  Lab 12/15/2017 0416  WBC 36.2*  HGB 7.2*  HCT 22.6*  PLT 85*   ------------------------------------------------------------------------------------------------------------------  Chemistries  Recent Labs  Lab 12/26/17 0638  12/26/2017 0416  NA 123*   < > 127*  K 3.4*   < > 3.5  CL 99*   < > 105  CO2 16*   < > 12*  GLUCOSE 142*   < > 166*  BUN 19   < > 20  CREATININE UNABLE TO REPORT DUE TO ICTERUS   < > 1.71*  CALCIUM 7.1*   < > 7.0*  MG 1.9  --   --   AST 199*  --  185*  ALT 71*  --  58  ALKPHOS 708*  --  463*  BILITOT 25.0*  --   27.9*   < > = values in this interval not displayed.   ------------------------------------------------------------------------------------------------------------------  Cardiac Enzymes Recent Labs  Lab 2017/10/08 0050  TROPONINI 0.10*   ------------------------------------------------------------------------------------------------------------------  RADIOLOGY:  Dg Abd 1 View  Result Date: 12/19/2017 CLINICAL DATA:  Abdominal distension. EXAM: ABDOMEN - 1 VIEW COMPARISON:  12/26/2017 FINDINGS: Nasogastric tube tip is at the gastric antrum. Diffuse gaseous distension of the small intestine again seen. Large amount of gas and fecal matter also present within the colon. Peripheral gas distribution in the right colon could either be intraluminal or indicate pneumatosis. IMPRESSION: Increase gaseous distension of the small intestine diffusely despite the presence of a nasogastric tube. Gas and fecal matter present within the colon as well. Peripheral gas distribution pattern in the right colon could be intraluminal or indicate pneumatosis. Consider CT if there is clinical concern about the possibility of bowel infarction. Electronically Signed   By: Jack FusiMark  Lowe M.D.   On: 12/17/2017 07:46   Dg Abd 1 View  Result Date: 12/26/2017 CLINICAL DATA:  Abdominal distention EXAM: ABDOMEN - 1 VIEW COMPARISON:  10/23/2017 FINDINGS: NG tube is in the stomach. Diffuse gaseous distention of the colon and lower abdominal small bowel loops. Small bowel distention is decreased with continued or slightly worsening gaseous distention of the colon. Favor ileus.  No free air organomegaly. IMPRESSION: Gaseous distention of distal small bowel loops and colon, most compatible with ileus. Electronically Signed   By: Jack Lowe M.D.   On: 12/26/2017 10:22   Dg Chest Port 1 View  Result Date: 12/15/2017 CLINICAL DATA:  Abdominal distension. EXAM: PORTABLE CHEST 1 VIEW COMPARISON:  12/26/2017 FINDINGS: Endotracheal tube  tip is 2 cm above the carina. Orogastric tube enters the abdomen. Right arm PICC tip is in the proximal right atrium. Slight worsening of edema pattern. Worsened basilar volume loss and/or infiltrate, particularly on the right. IMPRESSION: Worsened volume loss and/or infiltrate, particularly on the right. Mild edema pattern. Electronically Signed   By: Jack Lowe M.D.   On: 12/19/2017 07:44   Dg Chest Port 1 View  Result Date: 12/26/2017 CLINICAL DATA:  Respiratory failure EXAM: PORTABLE CHEST 1 VIEW COMPARISON:  12/25/2017 FINDINGS: Endotracheal tube and nasogastric catheter are noted in satisfactory position. Cardiac shadow is stable. Right-sided PICC line is noted at the cavoatrial junction. Increased right basilar atelectasis is noted. No bony abnormality is noted. IMPRESSION: Increased right basilar atelectasis. Tubes and lines as described. Electronically Signed   By: Alcide Clever M.D.   On: 12/26/2017 09:31     ASSESSMENT AND PLAN:    57 year old male with history of EtOH abuse who is brought to the ER due to altered mental status.   1.  Acute hypoxic respiratory failure due to respiratory arrest pneumonia: Vent management as per intensivist Continue cefepime and vancomycin  2.  Acute metabolic encephalopathy in the setting of EtOH abuse and profound hyponatremia Continue lactulose and Xifaxan Monitor sodium levels  Patient on N S for hyponatremia and being followed by Nephrology  3.  Hypotension after intubation due to sedation: Keep MAP>70 Weaning pressors this am Wean hydrocortisone as tolerated 4.  Hypokalemia: Repleted   5.  Acute alcoholic hepatitis: GI consultation appreciated Continue Trental IV thiamine to prevent Warnicke's encephalopathy as per GI recommendations Abdominal ultrasound did not show profound amount of ascites  6.  EtOH withdrawal: Currently sedated on ventilator  7.  Acute kidney injury: Patient may be developing hepatorenal syndrome Low  threshold for CRRT as per nephrology consultation. Hold nephrotoxic medications May need Midrin, octreotide and albumin infusion as per GI due to hepatorenal syndrome  8.  Acute anemia on chronic disease: Continue PPI Patient would benefit from 1 unit PRBC GI following  9.  Distended abdomen with elevated lactic acid and procalcitonin level: CT the abdomen ordered this morning. 10.  Thrombocytopenia from acute illness   Patient is critically ill Consider palliative care consultation for goals of care  Management plans discussed with the nursing CODE STATUS: FULL  TOTAL TIME TAKING CARE OF THIS PATIENT: 24 minutes.     POSSIBLE D/C 4-6 days, DEPENDING ON CLINICAL CONDITION.   Harvis Mabus M.D on 12/20/2017 at 8:58 AM  Between 7am to 6pm - Pager - [phone] After 6pm go to [email or web address] - password [email or web address]  [facility]  Office  [phone]  CC: Primary care physician; System, Pcp Not In  Note: This dictation was prepared with Dragon dictation along with smaller phrase technology. Any transcriptional errors that result from this process are unintentional.

## 2018-01-01 NOTE — Progress Notes (Signed)
[facility]  ROUNDING NOTE   Subjective:   UOP 435   Norepinephrine, phenylephrine and vasopressin infusion.   Sodium bicarb gtt  IV albumin  Objective:  Vital signs in last 24 hours:  Temp:  [98.7 F (37.1 C)-100 F (37.8 C)] 98.8 F (37.1 C) (04/26 0735) Pulse Rate:  [61-115] 105 (04/26 0800) Resp:  [13-31] 15 (04/26 0800) BP: (87-143)/(46-71) 99/65 (04/26 0800) SpO2:  [89 %-100 %] 91 % (04/26 0800) FiO2 (%):  [35 %-70 %] 70 % (04/26 0707) Weight:  [100.5 kg (221 lb 9 oz)] 100.5 kg (221 lb 9 oz) (04/26 0402)  Weight change: 1.4 kg (3 lb 1.4 oz) Filed Weights   12/25/17 0500 12/26/17 0414 Jan 08, 2018 0402  Weight: 96.6 kg (212 lb 15.4 oz) 99.1 kg (218 lb 7.6 oz) 100.5 kg (221 lb 9 oz)    Intake/Output: I/O last 3 completed shifts: In: 3896.1 [I.V.:3896.1] Out: 7341 [Urine:1330; Emesis/NG output:495]   Intake/Output this shift:  Total I/O In: 200 [I.V.:200] Out: 15 [Urine:15]  Physical Exam: General: Critically Ill  Head: +ETT, +OGT  Eyes: +icterus  Neck: Supple, trachea midline  Lungs:  Bilateral crackles, PRVC FIO2 70%  Heart: Regular rate and rhythm  Abdomen:  +distended  Extremities:  + peripheral edema.  Neurologic: Intubated, sedated  Skin: +jaundice  GU: Foley with dark urine    Basic Metabolic Panel: Recent Labs  Lab 12/29/2017 0050  12/25/17 2113  12/26/17 0423 12/26/17 9379 12/26/17 0836 12/26/17 0933 12/26/17 1715 2018-01-08 0416  NA 106*   < > 122*   < > TEST WILL BE CREDITED 123* 122*  --  124* 127*  K 3.6   < > 3.1*  --  TEST WILL BE CREDITED 3.4*  --   --  3.0* 3.5  CL 72*   < > 96*  --  TEST WILL BE CREDITED 99*  --   --  99* 105  CO2 16*   < > 13*  --  TEST WILL BE CREDITED 16*  --   --  13* 12*  GLUCOSE 77   < > 180*  --  TEST WILL BE CREDITED 142*  --   --  133* 166*  BUN 17   < > 19  --  TEST WILL BE CREDITED 19  --   --  19 20  CREATININE UNABLE TO REPORT DUE TO ICTERUS   < > 1.66*  --  TEST WILL BE CREDITED UNABLE  TO REPORT DUE TO ICTERUS  --   UNABLE TO REPORT DUE TO ICTERIC INTERFERENCE SDR UNABLE TO CALCULATE DUE TO ICTERUS AKT 1.71*  CALCIUM 7.4*   < > 6.6*  --  TEST WILL BE CREDITED 7.1*  --   --  6.9* 7.0*  MG 1.4*  --   --   --  TEST WILL BE CREDITED 1.9  --   --   --   --   PHOS UNABLE TO REPORT DUE TO ICTERUS  --   --   --  TEST WILL BE CREDITED UNABLE TO REPORT DUE TO ICTERUS  --   --   --   --    < > = values in this interval not displayed.    Liver Function Tests: Recent Labs  Lab 12/23/2017 0050 12/25/17 0500 12/26/17 0423 12/26/17 0638 01-08-18 0416  AST 174* 207* TEST WILL BE CREDITED 199* 185*  ALT 78* 69* TEST WILL BE CREDITED 71* 58  ALKPHOS 810* 705* TEST WILL BE CREDITED  708* 463*  BILITOT 27.5* 24.4* TEST WILL BE CREDITED 25.0* 27.9*  PROT 5.3* 4.6* TEST WILL BE CREDITED 5.2* 5.1*  ALBUMIN 1.9* 1.7* TEST WILL BE CREDITED 1.7* 2.6*   Recent Labs  Lab 12/23/2017 0050  LIPASE 31   Recent Labs  Lab 12/23/2017 0050 12/26/17 0423  AMMONIA 140* 151*    CBC: Recent Labs  Lab 12/14/2017 0050 12/25/17 0500 12/26/17 0423 Jan 11, 2018 0416  WBC 22.1* 19.3* 21.6* 36.2*  NEUTROABS 20.1*  --   --   --   HGB 9.0* 8.6* 7.7* 7.2*  HCT 26.4* 25.5* 23.2* 22.6*  MCV 110.6* 110.9* 111.2* 115.1*  PLT 171 197 152 85*    Cardiac Enzymes: Recent Labs  Lab 12/16/2017 0050 12/23/2017 1923  CKTOTAL  --  78  TROPONINI 0.10*  --     BNP: Invalid input(s): POCBNP  CBG: Recent Labs  Lab 12/26/17 1555 12/26/17 1926 12/26/17 2316 Jan 11, 2018 0312 2018/01/11 0709  GLUCAP 141* 103* 84 84 84    Microbiology: Results for orders placed or performed during the hospital encounter of 12/15/2017  Blood culture (routine x 2)     Status: None (Preliminary result)   Collection Time: 12/23/2017  2:52 AM  Result Value Ref Range Status   Specimen Description BLOOD LEFT HAND  Final   Special Requests   Final    BOTTLES DRAWN AEROBIC AND ANAEROBIC Blood Culture results may not be optimal due to an  excessive volume of blood received in culture bottles   Culture   Final    NO GROWTH 3 DAYS Performed at [facility], [address] [place], [place] [address]    Report Status PENDING  Incomplete  Blood culture (routine x 2)     Status: None (Preliminary result)   Collection Time: 12/14/2017  2:52 AM  Result Value Ref Range Status   Specimen Description BLOOD BLOOD RIGHT WRIST  Final   Special Requests   Final    BOTTLES DRAWN AEROBIC AND ANAEROBIC Blood Culture results may not be optimal due to an excessive volume of blood received in culture bottles   Culture   Final    NO GROWTH 3 DAYS Performed at [facility] [address] [place], [place] [address]    Report Status PENDING  Incomplete  MRSA PCR Screening     Status: None   Collection Time: 12/25/2017 11:27 AM  Result Value Ref Range Status   MRSA by PCR NEGATIVE NEGATIVE Final    Comment:        The GeneXpert MRSA Assay (FDA approved for NASAL specimens only), is one component of a comprehensive MRSA colonization surveillance program. It is not intended to diagnose MRSA infection nor to guide or monitor treatment for MRSA infections. Performed at [facility], [address] [place], [place] [address]   Culture, respiratory (NON-Expectorated)     Status: None (Preliminary result)   Collection Time: 12/25/17 11:54 AM  Result Value Ref Range Status   Specimen Description   Final    TRACHEAL ASPIRATE Performed at [facility], [address], [place], [place] [address]    Special Requests   Final    NONE Performed at [facility], [address] [place], [place] [address]    Gram Stain   Final    RARE WBC PRESENT, PREDOMINANTLY PMN MODERATE GRAM NEGATIVE RODS Performed at [facility], [place] [address], [place], [place] [address]    Culture MODERATE PSEUDOMONAS AERUGINOSA  Final   Report Status PENDING  Incomplete   Organism ID, Bacteria PSEUDOMONAS  AERUGINOSA  Final      Susceptibility   Pseudomonas aeruginosa - MIC*    CEFTAZIDIME 2 SENSITIVE Sensitive     CIPROFLOXACIN <=0.25 SENSITIVE Sensitive     GENTAMICIN <=1 SENSITIVE Sensitive     IMIPENEM 1 SENSITIVE Sensitive     PIP/TAZO <=4 SENSITIVE Sensitive     CEFEPIME <=1 SENSITIVE Sensitive     * MODERATE PSEUDOMONAS AERUGINOSA    Coagulation Studies: Recent Labs    12/26/17 0423  LABPROT 15.6*  INR 1.25    Urinalysis: No results for input(s): COLORURINE, LABSPEC, PHURINE, GLUCOSEU, HGBUR, BILIRUBINUR, KETONESUR, PROTEINUR, UROBILINOGEN, NITRITE, LEUKOCYTESUR in the last 72 hours.  Invalid input(s): APPERANCEUR    Imaging: Dg Abd 1 View  Result Date: 01-07-2018 CLINICAL DATA:  Abdominal distension. EXAM: ABDOMEN - 1 VIEW COMPARISON:  12/26/2017 FINDINGS: Nasogastric tube tip is at the gastric antrum. Diffuse gaseous distension of the small intestine again seen. Large amount of gas and fecal matter also present within the colon. Peripheral gas distribution in the right colon could either be intraluminal or indicate pneumatosis. IMPRESSION: Increase gaseous distension of the small intestine diffusely despite the presence of a nasogastric tube. Gas and fecal matter present within the colon as well. Peripheral gas distribution pattern in the right colon could be intraluminal or indicate pneumatosis. Consider CT if there is clinical concern about the possibility of bowel infarction. Electronically Signed   By: Nelson Chimes M.D.   On: 01-07-2018 07:46   Dg Abd 1 View  Result Date: 12/26/2017 CLINICAL DATA:  Abdominal distention EXAM: ABDOMEN - 1 VIEW COMPARISON:  12/30/2017 FINDINGS: NG tube is in the stomach. Diffuse gaseous distention of the colon and lower abdominal small bowel loops. Small bowel distention is decreased with continued or slightly worsening gaseous distention of the colon. Favor ileus. No free air organomegaly. IMPRESSION: Gaseous distention of distal small bowel  loops and colon, most compatible with ileus. Electronically Signed   By: Rolm Baptise M.D.   On: 12/26/2017 10:22   Dg Chest Port 1 View  Result Date: 07-Jan-2018 CLINICAL DATA:  Abdominal distension. EXAM: PORTABLE CHEST 1 VIEW COMPARISON:  12/26/2017 FINDINGS: Endotracheal tube tip is 2 cm above the carina. Orogastric tube enters the abdomen. Right arm PICC tip is in the proximal right atrium. Slight worsening of edema pattern. Worsened basilar volume loss and/or infiltrate, particularly on the right. IMPRESSION: Worsened volume loss and/or infiltrate, particularly on the right. Mild edema pattern. Electronically Signed   By: Nelson Chimes M.D.   On: Jan 07, 2018 07:44   Dg Chest Port 1 View  Result Date: 12/26/2017 CLINICAL DATA:  Respiratory failure EXAM: PORTABLE CHEST 1 VIEW COMPARISON:  12/25/2017 FINDINGS: Endotracheal tube and nasogastric catheter are noted in satisfactory position. Cardiac shadow is stable. Right-sided PICC line is noted at the cavoatrial junction. Increased right basilar atelectasis is noted. No bony abnormality is noted. IMPRESSION: Increased right basilar atelectasis. Tubes and lines as described. Electronically Signed   By: Inez Catalina M.D.   On: 12/26/2017 09:31     Medications:   . albumin human Stopped (12/26/17 2213)  . ceFEPime (MAXIPIME) IV Stopped (01/07/18 0459)  . dexmedetomidine (PRECEDEX) IV infusion 1.2 mcg/kg/hr (2018-01-07 0745)  . fentaNYL infusion INTRAVENOUS 225 mcg/hr (01/07/2018 0600)  . norepinephrine (LEVOPHED) Adult infusion 40 mcg/min (01/07/2018 0732)  . phenylephrine (NEO-SYNEPHRINE) Adult infusion 50 mcg/min (01/07/18 0732)  .  sodium bicarbonate  infusion  1000 mL 75 mL/hr at 01/01/18 0605  . sodium chloride    . thiamine injection Stopped (12/26/17 1051)  . vasopressin (PITRESSIN) infusion - *FOR SHOCK* 0.03 Units/min (Jan 01, 2018 0600)   . chlorhexidine gluconate (MEDLINE KIT)  15 mL Mouth Rinse BID  . hydrocortisone sod succinate (SOLU-CORTEF)  inj  50 mg Intravenous Q12H  . iopamidol  15 mL Oral Q1 Hr x 2  . lactulose  30 g Per Tube TID  . mouth rinse  15 mL Mouth Rinse QID  . multivitamin  15 mL Oral Daily  . pantoprazole  40 mg Intravenous Q12H  . pentoxifylline  400 mg Oral Q breakfast  . rifaximin  400 mg Per Tube TID   bisacodyl, fentaNYL (SUBLIMAZE) injection, ipratropium-albuterol, midazolam, sodium chloride flush  Assessment/ Plan:  Jack Lowe is a 57 y.o. white male with schizophrenia, hypertension, COPD, alcohol abuse , who was admitted to [facility] on 12/03/2017.   1. Acute Renal Failure: baseline creatinine 0.5 09/24/2017. Oliguric. Creatinine unable to be measured as hyperbilirubinemia causing interference with assay.   Concerning for development of hepatorenal syndrome.  - Agree with GI input.   - Overall prognosis is poor. However if aggressive measures are called for: will recommend renal replacement therapy.   2. Hyponatremia: 106 on admission. Currently on hypertonic saline. With hypervolemia - Correction of sodium is appropriate. 127 on last check - Completed course of hypertonic saline.   3. Hypokalemia: with metabolic acidosis - status post IV potassium replacement.   4. Acute hepatitis: - systemic steroids - pentoxifylline - MELD score of 30 on 4/24. Which is a 52.6% mortality in the next 3 months.  - Appreciate GI input.   5. Acute respiratory failure: requiring mechanical ventilation. Volume overload on CXR.  Requiring increased O2 requirements this morning.   Overall prognosis is grim. Will discuss with patient's children later today.    LOS: 3 Jack Lowe 05/01/20199:19 AM

## 2018-01-01 NOTE — Progress Notes (Addendum)
PULMONARY / CRITICAL CARE MEDICINE   Name: Jack Lowe MRN: [id number] DOB: November 07, 1960    ADMISSION DATE:  12/06/2017 CONSULTATION DATE:  04/23  PT PROFILE:   61 M with history of alcohol abuse, alcoholic hepatitis, delirium tremens, schizophrenia brought to ED 04/23 by family for AMS, profound jaundice.  Found to be profoundly hyponatremic (Na 106).  Admitted to ICU/SDU.  Suffered apneic respiratory arrest in radiology prior to abdominal ultrasound.  MAJOR EVENTS/TEST RESULTS: 04/23 admission as documented above 04/23 suffered respiratory arrest in radiology.  Intubated 04/23 CT head: No acute findings 04/23 CTAP: Fatty liver.  Otherwise, no acute findings 04/23 Abd [place]: The study is limited due to shadowing bowel gas. No cause for sepsis identified. Hepatic steatosis  04/23 Hypotension > norepinephrine initiated 04/24 Requiring high dose norepi (36 mcg/min). Vasopressin initiated. Remains on hypertonic saline. Na gradually improving. Requiring sedation. High Ve prohibits weaning. Vent changes made. Oliguric (Uo 280cc/24 hrs). Propofol changed to dex gtt + intermittent lorazepam + intermittent fentanyl.  Hypertonic saline discontinued for sodium 136 (which was probably a spurious value) 04/25 intermittently agitated requiring dexmedetomidine infusion.  Tolerates PSV 10 cm H2O.  Worsening abdominal distention.  KUB consistent with ileus pattern.  Albumin initiated by gastroenterology.  Vasopressor requirements improving. 04/26 Patient now on 3 vasopressors, CT abdomen and pelvis done INDWELLING DEVICES:: ETT 04/23 >>  RUE PICC 04/23 >>   MICRO DATA: MRSA PCR 04/23 >> NEG  Blood 04/23 >>  Resp 04/24 >> Pseudomonas  ANTIMICROBIALS:  Ceftriaxone 04/23 >> 04/24 Cefepime 04/24 >>   SUBJECTIVE:  Increase in vasopressor requirement ibn the night- now on Levophed, Vasopressin and Phenylephrine. Abdomen very distended and firm, Elevated PAP with decreased Urinary output VITAL  SIGNS: BP (!) 92/58   Pulse (!) 110   Temp 98.8 F (37.1 C) (Axillary)   Resp 15   Ht 5\' 10"  (1.778 m)   Wt 221 lb 9 oz (100.5 kg)   SpO2 94%   BMI 31.79 kg/m   HEMODYNAMICS:  on 3 vasopressors  VENTILATOR SETTINGS: Vent Mode: PRVC FiO2 (%):  [35 %-70 %] 70 % Set Rate:  [15 bmp] 15 bmp Vt Set:  [600 mL] 600 mL PEEP:  [5 cmH20] 5 cmH20 Pressure Support:  [10 cmH20] 10 cmH20  INTAKE / OUTPUT: I/O last 3 completed shifts: In: 3896.1 [I.V.:3896.1] Out: 1825 [Urine:1330; Emesis/NG output:495]  PHYSICAL EXAMINATION: General: Profoundly icteric, intubated, sedate, not F/C Neuro: sedated on Precedex and Fentanyl drips HEENT: NCAT, severe sclericterus Cardiovascular: Regular, no murmur Lungs: Clear anteriorly Abdomen: Markedly distended, firm, absent BS Ext: Warm, 1+ edema Skin: Severe jaundice  LABS:  BMET Recent Labs  Lab 12/26/17 0638 12/26/17 0836 12/26/17 0933 12/26/17 1715 12-28-17 0416  NA 123* 122*  --  124* 127*  K 3.4*  --   --  3.0* 3.5  CL 99*  --   --  99* 105  CO2 16*  --   --  13* 12*  BUN 19  --   --  19 20  CREATININE UNABLE TO REPORT DUE TO ICTERUS  --   UNABLE TO REPORT DUE TO ICTERIC INTERFERENCE SDR UNABLE TO CALCULATE DUE TO ICTERUS AKT 1.71*  GLUCOSE 142*  --   --  133* 166*    Electrolytes Recent Labs  Lab 12/26/2017 0050  12/26/17 0423 12/26/17 0638 12/26/17 1715 28-Dec-2017 0416  CALCIUM 7.4*   < > TEST WILL BE CREDITED 7.1* 6.9* 7.0*  MG 1.4*  --  TEST WILL BE  CREDITED 1.9  --   --   PHOS UNABLE TO REPORT DUE TO ICTERUS  --  TEST WILL BE CREDITED UNABLE TO REPORT DUE TO ICTERUS  --   --    < > = values in this interval not displayed.    CBC Recent Labs  Lab 12/25/17 0500 12/26/17 0423 09-Apr-2018 0416  WBC 19.3* 21.6* 36.2*  HGB 8.6* 7.7* 7.2*  HCT 25.5* 23.2* 22.6*  PLT 197 152 85*    Coag's Recent Labs  Lab 12/28/2017 0050 12/26/17 0423  APTT 43* 38*  INR 1.12 1.25    Sepsis Markers Recent Labs  Lab 12/15/2017 0050  12/15/2017 0522 12/25/17 1222 12/26/17 0423 09-Apr-2018 0416 09-Apr-2018 0729  LATICACIDVEN 2.7* 1.3  --   --   --  3.7*  PROCALCITON  --   --  5.70 3.21 19.10  --     ABG Recent Labs  Lab 12/12/2017 1118 09-Apr-2018 0505  PHART 7.18* 7.15*  PCO2ART 51* 32  PO2ART 70* 55*    Liver Enzymes Recent Labs  Lab 12/26/17 0423 12/26/17 0638 09-Apr-2018 0416  AST TEST WILL BE CREDITED 199* 185*  ALT TEST WILL BE CREDITED 71* 58  ALKPHOS TEST WILL BE CREDITED 708* 463*  BILITOT TEST WILL BE CREDITED 25.0* 27.9*  ALBUMIN TEST WILL BE CREDITED 1.7* 2.6*    Cardiac Enzymes Recent Labs  Lab 12/02/2017 0050  TROPONINI 0.10*    Glucose Recent Labs  Lab 12/26/17 1131 12/26/17 1555 12/26/17 1926 12/26/17 2316 09-Apr-2018 0312 09-Apr-2018 0709  GLUCAP 149* 141* 103* 84 84 84    CXR: Improved RLL ATX vs infiltrate    ASSESSMENT / PLAN:  NEUROLOGIC A:   Chronic alcohol abuse  Hx of DTs Acute encephalopathy due Multifactorial - hyponatremia Hyperammonemia /hepatic encephalopathy P:   RASS goal: 0 Continue dexmedetomidine infusion (initiated 04/24) Cont intermittent fentanyl and lorazepam Cont lactulose and rifaximin  Continue normal saline at current rate Cont high dose thiamine  PULMONARY A: VDRF after respiratory arrest 04/23 Smoker with documented history of COPD  No bronchospasm presently  RLL pneumonia- Pseudomonas High PAP R/O compartment syndrome P:   Cont vent support - settings reviewed and/or adjusted Wean in PSV mode as tolerated Cont vent bundle Daily SBT if/when meets criteria  Cont PRN nebs Cannot be extubated until cognition improves significantly  CARDIOVASCULAR A:  Severe Sepsis with shock on multiple vasopressor P:  MAP goal >70 mmHg Cont norepinephrine  Vasopressin and Phenylephrine started in the night  Albumin initiated 04/25 by gastroenterology  RENAL A:   Profound hyponatremia, improving Mild metabolic acidosis Anuria ARF with anuric R/O  abdominal compartment syndrome  (very elevated bilirubin is interfering with Cr assay) Hypokalemia P:   CT Abdomen and pelvis stat Transduce vesicular pressure   GASTROINTESTINAL A:   Acute Abdomen with Ischemic bowel- patient on multiple vasopressors and BP still labile.  He is not a surgical candidate  Acute hepatic failure Alcoholic hepatitis Liver Cirrhosis Ileus P:  SUP: IV PPI Will D/CPentoxifylline initiated 04/23 Holding TFs for now  HEMATOLOGIC A:   Macrocytic anemia Thrombocytopenia from severe sepsis P:  DVT px: SCDs Monitor CBC intermittently Transfuse per usual guidelines  Cont Folate   INFECTIOUS A:   RLL Pseudomonas pneumonia Elevated PCT P:   Monitor temp, WBC count Micro and abx as above  Following PCT  ENDOCRINE A:   No acute issues P:   Monitor CBGs intermittently   FAMILY:Family which included patient's 2 daughters.  They understand  the severity of their dad's illness and have changed the code status to DNR.  They are considering comfort care.  16: 38: patient's daughters have changed code status to comfort care.  CCM time: 45 mins The above time includes time spent in consultation with patient and/or family members and reviewing care plan on multidisciplinary rounds  Jackson Latino, MD PCCM service Pager [phone] 12/02/2017, 9:13 AM

## 2018-01-01 NOTE — Progress Notes (Signed)
   Mr Jack Lowe was admitted to hospital on 12/03/2017.  His condition worsened and he was moved to the ICU.  Unfortunately he was not able to survive his admission and died at 17:12 hours.  His daughters and additional family members were present at the bedside.  Jackson LatinoKarol Fed Ceci, MD

## 2018-01-01 NOTE — Death Summary Note (Signed)
DEATH SUMMARY   Patient Details  Name: Jack Lowe MRN: [id number] DOB: Oct 31, 1960  Admission/Discharge Information   Admit Date:  2017/12/30  Date of Death: Date of Death: 01/02/18  Time of Death: Time of Death: 01/16/1711  Length of Stay: 3  Referring Physician: System, Pcp Not In   Reason(s) for Hospitalization  altered mental status  Diagnoses  Preliminary cause of death:  Bowel ischemia Secondary Diagnoses (including complications and co-morbidities):  Active Problems:   Liver failure (HCC) Chronic alcohol abuse  Acute encephalopathy due Multifactorial - hyponatremia Hyperammonemia /hepatic encephalopathy VDRF after respiratory arrest 12-31-2022 Smoker with documented history of COPD  RLL pneumonia- Pseudomonas High PAP R/O compartment syndrome Severe Sepsis with shock on multiple vasopressor Profound hyponatremia, improving Mild metabolic acidosis Anuria ARF with anuric R/O abdominal compartment syndrome Acute Abdomen with Ischemic bowel- patient on multiple vasopressors and BP still labile.  He is not a surgical candidate  Acute hepatic failure Alcoholic hepatitis Liver Cirrhosis Macrocytic anemia Thrombocytopenia from severe sepsis RLL Pseudomonas pneumonia  Brief Hospital Course (including significant findings, care, treatment, and services provided and events leading to death)  Jack Lowe is a 57 y.o. year old male with history of alcohol abuse, alcoholic hepatitis, delirium tremens, schizophrenia brought to ED 12/31/22 by family for AMS, profound jaundice.  Found to be profoundly hyponatremic (Na 106).  Admitted to ICU/SDU.  Suffered apneic respiratory arrest in radiology prior to abdominal ultrasound. Was resuscitated successfully with ROSC. Post cardiac arrest, patient continued to deteriorate and developed bowel ischemia overnight.  In consultation with family, they opted for no further interventions and made patient DNR/Comfort care. End of life care orders  placed and patient expired peacefully.   Pertinent Labs and Studies  Significant Diagnostic Studies Ct Abdomen Pelvis Wo Contrast  Result Date: 2018/01/02 CLINICAL DATA:  Abdominal distention.  Jaundice. EXAM: CT ABDOMEN AND PELVIS WITHOUT CONTRAST TECHNIQUE: Multidetector CT imaging of the abdomen and pelvis was performed following the standard protocol without IV contrast. COMPARISON:  2017-12-30 FINDINGS: Lower chest: Heart is enlarged. Distal tip of a right PICC line is positioned at the SVC/RA junction. Right lower lobe collapse/consolidation is progressive in the interval. Small bilateral pleural effusions are noted and there is new patchy airspace disease in the left base. Hepatobiliary: Liver measures 24.9 cm craniocaudal length. The liver shows diffusely decreased attenuation suggesting steatosis. Probable sludge within the gallbladder. No portal venous gas. No intrahepatic or extrahepatic biliary dilation. Pancreas: No focal mass lesion. No dilatation of the main duct. No intraparenchymal cyst. No peripancreatic edema. Spleen: No splenomegaly. No focal mass lesion. Adrenals/Urinary Tract: No adrenal nodule or mass. Kidneys unremarkable. No evidence for hydroureter. Foley catheter decompresses the urinary bladder. Stomach/Bowel: NG tube tip is in the distal stomach Duodenum is normally positioned as is the ligament of Treitz. Small bowel is mildly distended and fluid-filled measuring up to about 3.8 cm diameter. Gas is identified within the wall the terminal ileum. Interval development of circumferential pneumatosis in the cecum and ascending colon up to about the level of the hepatic flexure. There is associated edema/inflammation around the right colon with trace fluid in the right paracolic gutter. Transverse colon is diffusely gas filled without wall thickening or pneumatosis. Left colon is decompressed and unremarkable by CT. Vascular/Lymphatic: No abdominal aortic aneurysm. Mild atherosclerotic  calcification is noted in the abdominal aorta with no substantial calcific plaque at the origin of the celiac axis, SMA, or IMA. There is no gastrohepatic or hepatoduodenal ligament lymphadenopathy. No intraperitoneal  or retroperitoneal lymphadenopathy. No pelvic sidewall lymphadenopathy. Reproductive: The prostate gland and seminal vesicles have normal imaging features. Other: Trace intraperitoneal free fluid evident. Musculoskeletal: Bone windows reveal no worrisome lytic or sclerotic osseous lesions. IMPRESSION: 1. Mild diffuse distention of small bowel with pneumatosis identified in the terminal ileum and right colon. There is around the right colon with some minimal fluid in the right paracolic gutter. Imaging features highly concerning for bowel ischemia. No evidence for portal venous gas. 2. Bilateral patchy areas of collapse/consolidation in both lungs, progressive in the interval with small bilateral pleural effusions. Imaging features suggest multifocal with steatosis. 3. Hepatomegaly with hepatic steatosis. Critical Value/emergent results were called by me at the time of interpretation on 12/14/2017 at 11:32 am to Jack Lowe , who verbally acknowledged these results. Electronically Signed   By: Kennith CenterEric  Lowe M.D.   On: 12/26/2017 11:32   Ct Abdomen Pelvis Wo Contrast  Result Date: 2017-12-31 CLINICAL DATA:  Abdominal distension. EXAM: CT ABDOMEN AND PELVIS WITHOUT CONTRAST TECHNIQUE: Multidetector CT imaging of the abdomen and pelvis was performed following the standard protocol without IV contrast. COMPARISON:  None. FINDINGS: Lower chest: Mild bilateral posterior basilar subsegmental atelectasis is noted. Hepatobiliary: No gallstones are noted. Fatty infiltration of the liver is noted. No biliary dilatation is noted. Pancreas: Unremarkable. No pancreatic ductal dilatation or surrounding inflammatory changes. Spleen: Normal in size without focal abnormality. Adrenals/Urinary Tract: Adrenal  glands are unremarkable. Kidneys are normal, without renal calculi, focal lesion, or hydronephrosis. Bladder is unremarkable. Stomach/Bowel: Stomach is within normal limits. Appendix appears normal. No evidence of bowel wall thickening, distention, or inflammatory changes. Vascular/Lymphatic: Aortic atherosclerosis. No enlarged abdominal or pelvic lymph nodes. Reproductive: Prostate is unremarkable. Other: No abnormal fluid collection is noted. Small fat containing right inguinal hernia is noted. Musculoskeletal: No acute or significant osseous findings. IMPRESSION: Fatty infiltration of the liver. No other significant abnormality seen in the abdomen or pelvis. Electronically Signed   By: Lupita RaiderJames  Green Jr, M.D.   On: 02019-04-30 10:26   Dg Abd 1 View  Result Date: 12/06/2017 CLINICAL DATA:  Abdominal distension. EXAM: ABDOMEN - 1 VIEW COMPARISON:  12/26/2017 FINDINGS: Nasogastric tube tip is at the gastric antrum. Diffuse gaseous distension of the small intestine again seen. Large amount of gas and fecal matter also present within the colon. Peripheral gas distribution in the right colon could either be intraluminal or indicate pneumatosis. IMPRESSION: Increase gaseous distension of the small intestine diffusely despite the presence of a nasogastric tube. Gas and fecal matter present within the colon as well. Peripheral gas distribution pattern in the right colon could be intraluminal or indicate pneumatosis. Consider CT if there is clinical concern about the possibility of bowel infarction. Electronically Signed   By: Paulina FusiMark  Shogry M.D.   On: 12/14/2017 07:46   Dg Abd 1 View  Result Date: 12/26/2017 CLINICAL DATA:  Abdominal distention EXAM: ABDOMEN - 1 VIEW COMPARISON:  02019-04-30 FINDINGS: NG tube is in the stomach. Diffuse gaseous distention of the colon and lower abdominal small bowel loops. Small bowel distention is decreased with continued or slightly worsening gaseous distention of the colon. Favor  ileus. No free air organomegaly. IMPRESSION: Gaseous distention of distal small bowel loops and colon, most compatible with ileus. Electronically Signed   By: Charlett NoseKevin  Dover M.D.   On: 12/26/2017 10:22   Dg Abd 1 View  Result Date: 2017-12-31 CLINICAL DATA:  Nasogastric tube placement. EXAM: ABDOMEN - 1 VIEW COMPARISON:  Radiographs of September 23, 2017.  FINDINGS: The bowel gas pattern is normal. Distal tip of nasogastric tube is seen in proximal stomach. No radio-opaque calculi or other significant radiographic abnormality are seen. IMPRESSION: Distal tip of nasogastric tube seen in proximal stomach. No definite evidence of bowel obstruction. Electronically Signed   By: Lupita Raider, M.D.   On: 2018-01-22 12:50   Ct Head Wo Contrast  Result Date: 01-22-2018 CLINICAL DATA:  Status post cardiac arrest with altered mental status EXAM: CT HEAD WITHOUT CONTRAST TECHNIQUE: Contiguous axial images were obtained from the base of the skull through the vertex without intravenous contrast. COMPARISON:  None. FINDINGS: Brain: The ventricles are normal in size and configuration. There is no intracranial mass, hemorrhage, extra-axial fluid collection, or midline shift. The gray-white compartments appear normal. No evident acute infarct. Vascular: No hyperdense vessel. No appreciable vascular calcification evident. Skull: The bony calvarium appears intact. Sinuses/Orbits: There is patchy opacity in several ethmoid air cells. There is slight mucosal thickening in the anterior sphenoid sinus regions. Other visualized paranasal sinuses are clear. Frontal sinuses are somewhat hypoplastic. Orbits appear symmetric bilaterally. Other: Mastoid air cells are clear. IMPRESSION: No mass or hemorrhage. Gray-white compartments appear normal. Mild paranasal sinus disease noted. Electronically Signed   By: Bretta Bang III M.D.   On: 01-22-18 10:16   US Abdomen Complete  Result Date: 01-22-18 CLINICAL DATA:  Sepsis EXAM:  ABDOMEN ULTRASOUND COMPLETE COMPARISON:  None. FINDINGS: Gallbladder: No gallstones or wall thickening visualized. No sonographic Murphy sign noted by sonographer. Common bile duct: Diameter: 3.3 mm Liver: Hepatic steatosis. No focal mass. Portal vein is patent on color Doppler imaging with normal direction of blood flow towards the liver. IVC: No abnormality visualized. Pancreas: The pancreas was not visualized. Spleen: Size and appearance within normal limits. Right Kidney: Length: 14.1 cm. Echogenicity within normal limits. No mass or hydronephrosis visualized. Left Kidney: Length: 13.6 cm. Echogenicity within normal limits. No mass or hydronephrosis visualized. Abdominal aorta: Not well visualized. Other findings: None. IMPRESSION: The study is limited due to shadowing bowel gas. No cause for sepsis identified. Hepatic steatosis. Electronically Signed   By: Gerome Sam III M.D   On: 22-Jan-2018 12:51   Dg Chest Port 1 View  Result Date: 12/19/2017 CLINICAL DATA:  Abdominal distension. EXAM: PORTABLE CHEST 1 VIEW COMPARISON:  12/26/2017 FINDINGS: Endotracheal tube tip is 2 cm above the carina. Orogastric tube enters the abdomen. Right arm PICC tip is in the proximal right atrium. Slight worsening of edema pattern. Worsened basilar volume loss and/or infiltrate, particularly on the right. IMPRESSION: Worsened volume loss and/or infiltrate, particularly on the right. Mild edema pattern. Electronically Signed   By: Paulina Fusi M.D.   On: 12/23/2017 07:44   Dg Chest Port 1 View  Result Date: 12/26/2017 CLINICAL DATA:  Respiratory failure EXAM: PORTABLE CHEST 1 VIEW COMPARISON:  12/25/2017 FINDINGS: Endotracheal tube and nasogastric catheter are noted in satisfactory position. Cardiac shadow is stable. Right-sided PICC line is noted at the cavoatrial junction. Increased right basilar atelectasis is noted. No bony abnormality is noted. IMPRESSION: Increased right basilar atelectasis. Tubes and lines as  described. Electronically Signed   By: Alcide Clever M.D.   On: 12/26/2017 09:31   Dg Chest Port 1 View  Result Date: 12/25/2017 CLINICAL DATA:  COPD, hepatic failure and alcohol withdrawal. Current smoker. Intubated patient. EXAM: PORTABLE CHEST 1 VIEW COMPARISON:  Chest x-ray of 01-22-18 FINDINGS: The left lung is well-expanded. There is minimal atelectasis or scarring at the left base.  On the right there is mild volume loss with persistent increased density largely obscuring the hemidiaphragm. There is a trace of pleural fluid on the right. There is no pneumothorax. The endotracheal tube tip projects 2.9 cm above the carina. The esophagogastric tube tip and proximal port project below the GE junction. The right-sided PICC line tip projects over the distal third of the SVC. IMPRESSION: Persistent increased density at the right lung base may reflect atelectasis or pneumonia. Probable trace right pleural effusion. The support tubes are in reasonable position. Electronically Signed   By: David  [place] M.D.   On: 12/25/2017 10:23   Dg Chest Port 1 View  Result Date: 12/28/2017 CLINICAL DATA:  57 y/o  M; PICC line placement. EXAM: PORTABLE CHEST 1 VIEW COMPARISON:  12/30/2017 chest radiograph. FINDINGS: Endotracheal tube 1.7 cm above the carina. Enteric tube tip in gastric body. Right PICC line tip projects over right atrium mild reticular opacities of the lungs are stable. No focal consolidation. No pleural effusion or pneumothorax identified. Bones are unremarkable. IMPRESSION: Right PICC line tip projects over right atrium. Stable mild reticular opacities of lungs, possibly vascular congestion. No consolidation. Electronically Signed   By: Mitzi Hansen M.D.   On: 12/17/2017 18:03   Dg Chest Port 1 View  Result Date: 12/04/2017 CLINICAL DATA:  Status post intubation. EXAM: PORTABLE CHEST 1 VIEW COMPARISON:  Radiographs of September 23, 2017. FINDINGS: Stable cardiomediastinal silhouette.  Endotracheal tube is seen projected over tracheal air shadow with distal tip 5 cm above the carina. No pneumothorax or pleural effusion is noted. No acute pulmonary disease is noted. Bony thorax is unremarkable. IMPRESSION: Endotracheal tube in grossly good position. No acute cardiopulmonary abnormality seen. Electronically Signed   By: Lupita Raider, M.D.   On: 12/11/2017 10:05   [place] Ekg Site Rite  Result Date: 12/04/2017 If Site Rite image not attached, placement could not be confirmed due to current cardiac rhythm.  [place] Ekg Site Rite  Result Date: 12/09/2017 If Site Rite image not attached, placement could not be confirmed due to current cardiac rhythm.   Microbiology Recent Results (from the past 240 hour(s))  Blood culture (routine x 2)     Status: None (Preliminary result)   Collection Time: 12/10/2017  2:52 AM  Result Value Ref Range Status   Specimen Description BLOOD LEFT HAND  Final   Special Requests   Final    BOTTLES DRAWN AEROBIC AND ANAEROBIC Blood Culture results may not be optimal due to an excessive volume of blood received in culture bottles   Culture   Final    NO GROWTH 3 DAYS Performed at [facility], [address], [place], [place] [address]    Report Status PENDING  Incomplete  Blood culture (routine x 2)     Status: None (Preliminary result)   Collection Time: 12/25/2017  2:52 AM  Result Value Ref Range Status   Specimen Description BLOOD BLOOD RIGHT WRIST  Final   Special Requests   Final    BOTTLES DRAWN AEROBIC AND ANAEROBIC Blood Culture results may not be optimal due to an excessive volume of blood received in culture bottles   Culture   Final    NO GROWTH 3 DAYS Performed at [facility], [address], [place], [place] [address]    Report Status PENDING  Incomplete  MRSA PCR Screening     Status: None   Collection Time: 12/20/2017 11:27 AM  Result Value Ref Range Status  MRSA by PCR NEGATIVE NEGATIVE Final    Comment:         The GeneXpert MRSA Assay (FDA approved for NASAL specimens only), is one component of a comprehensive MRSA colonization surveillance program. It is not intended to diagnose MRSA infection nor to guide or monitor treatment for MRSA infections. Performed at [facility], [address] [place], [place] [address]   Culture, respiratory (NON-Expectorated)     Status: None   Collection Time: 12/25/17 11:54 AM  Result Value Ref Range Status   Specimen Description   Final    TRACHEAL ASPIRATE Performed at [facility], [address] [place], [place] [address]    Special Requests   Final    NONE Performed at [facility], [address] [place], [place] [address]    Gram Stain   Final    RARE WBC PRESENT, PREDOMINANTLY PMN MODERATE GRAM NEGATIVE RODS Performed at [facility], [address] [place], [place] [address]    Culture MODERATE PSEUDOMONAS AERUGINOSA  Final   Report Status 12/31/2017 FINAL  Final   Organism ID, Bacteria PSEUDOMONAS AERUGINOSA  Final      Susceptibility   Pseudomonas aeruginosa - MIC*    CEFTAZIDIME 2 SENSITIVE Sensitive     CIPROFLOXACIN <=0.25 SENSITIVE Sensitive     GENTAMICIN <=1 SENSITIVE Sensitive     IMIPENEM 1 SENSITIVE Sensitive     PIP/TAZO <=4 SENSITIVE Sensitive     CEFEPIME <=1 SENSITIVE Sensitive     * MODERATE PSEUDOMONAS AERUGINOSA    Lab Basic Metabolic Panel: Recent Labs  Lab Jan 12, 2018 0050  12/25/17 2113  12/26/17 0423 12/26/17 2956 12/26/17 0836 12/26/17 0933 12/26/17 1715 12/07/2017 0416  NA 106*   < > 122*   < > TEST WILL BE CREDITED 123* 122*  --  124* 127*  K 3.6   < > 3.1*  --  TEST WILL BE CREDITED 3.4*  --   --  3.0* 3.5  CL 72*   < > 96*  --  TEST WILL BE CREDITED 99*  --   --  99* 105  CO2 16*   < > 13*  --  TEST WILL BE CREDITED 16*  --   --  13* 12*  GLUCOSE 77   < > 180*  --  TEST WILL BE CREDITED 142*  --   --  133* 166*  BUN 17   < > 19  --  TEST WILL BE CREDITED 19   --   --  19 20  CREATININE UNABLE TO REPORT DUE TO ICTERUS   < > 1.66*  --  TEST WILL BE CREDITED UNABLE TO REPORT DUE TO ICTERUS  --   UNABLE TO REPORT DUE TO ICTERIC INTERFERENCE SDR UNABLE TO CALCULATE DUE TO ICTERUS AKT 1.71*  CALCIUM 7.4*   < > 6.6*  --  TEST WILL BE CREDITED 7.1*  --   --  6.9* 7.0*  MG 1.4*  --   --   --  TEST WILL BE CREDITED 1.9  --   --   --   --   PHOS UNABLE TO REPORT DUE TO ICTERUS  --   --   --  TEST WILL BE CREDITED UNABLE TO REPORT DUE TO ICTERUS  --   --   --   --    < > = values in this interval not displayed.   Liver Function Tests: Recent Labs  Lab 01/12/18 0050 12/25/17 0500  12/26/17 0423 12/26/17 1610 17-Jan-2018 0416  AST 174* 207* TEST WILL BE CREDITED 199* 185*  ALT 78* 69* TEST WILL BE CREDITED 71* 58  ALKPHOS 810* 705* TEST WILL BE CREDITED 708* 463*  BILITOT 27.5* 24.4* TEST WILL BE CREDITED 25.0* 27.9*  PROT 5.3* 4.6* TEST WILL BE CREDITED 5.2* 5.1*  ALBUMIN 1.9* 1.7* TEST WILL BE CREDITED 1.7* 2.6*   Recent Labs  Lab 12/11/2017 0050  LIPASE 31   Recent Labs  Lab 12/30/2017 0050 12/26/17 0423  AMMONIA 140* 151*   CBC: Recent Labs  Lab 12/20/2017 0050 12/25/17 0500 12/26/17 0423 01/17/18 0416  WBC 22.1* 19.3* 21.6* 36.2*  NEUTROABS 20.1*  --   --   --   HGB 9.0* 8.6* 7.7* 7.2*  HCT 26.4* 25.5* 23.2* 22.6*  MCV 110.6* 110.9* 111.2* 115.1*  PLT 171 197 152 85*   Cardiac Enzymes: Recent Labs  Lab 12/05/2017 0050 12/18/2017 1923  CKTOTAL  --  78  TROPONINI 0.10*  --    Sepsis Labs: Recent Labs  Lab 12/08/2017 0050 12/14/2017 0522 12/25/17 0500 12/25/17 1222 12/26/17 0423 01/17/18 0416 2018-01-17 0729  PROCALCITON  --   --   --  5.70 3.21 19.10  --   WBC 22.1*  --  19.3*  --  21.6* 36.2*  --   LATICACIDVEN 2.7* 1.3  --   --   --   --  3.7*    Procedures/Operations  Central venous catheter Endotracheal tube placement  Annett Fabian 01/17/2018, 7:21 PM

## 2018-01-01 NOTE — Progress Notes (Signed)
Pt needing increased Vasopressor support NP informed, 2 new vasopressors added as need in patient BP increased. 3 amps of bicarb given, along with bicarb gtt started. Pt also began to desat into the 80s. FiO2 increased from 35 to 70%. ABG obtained by RT, results given to NP. Critical results reported to NP. Procalcitonin marked increase from 3 to 19.1. NP also informed of patient minimal output. Will continue to monitor.

## 2018-01-01 DEATH — deceased

## 2018-01-10 NOTE — Telephone Encounter (Signed)
Jack Lowe from [facility] calling asking if we received a hard copy of the death certificate  and have mailed it back to health department  Please call to give him and update

## 2018-01-10 NOTE — Telephone Encounter (Addendum)
Returned call to [employer]. Notified that we do not have original. He will have someone bring original down here for completion. He thought that the original had been mailed but not sure.

## 2018-01-16 NOTE — Telephone Encounter (Signed)
Jack Lowe has called office several times checking the status of receipt original death certificate. As of Jan 28, 2018 we still have not received death certificate. This note will be closed.

## 2019-10-30 IMAGING — DX DG ABDOMEN 1V
2 series · 2 of 2 positions shown · non-contrast
Comparison: 12/26/2017

CLINICAL DATA: Abdominal distension.

EXAM:
ABDOMEN - 1 VIEW

[abdomen kub (1 of 2)]
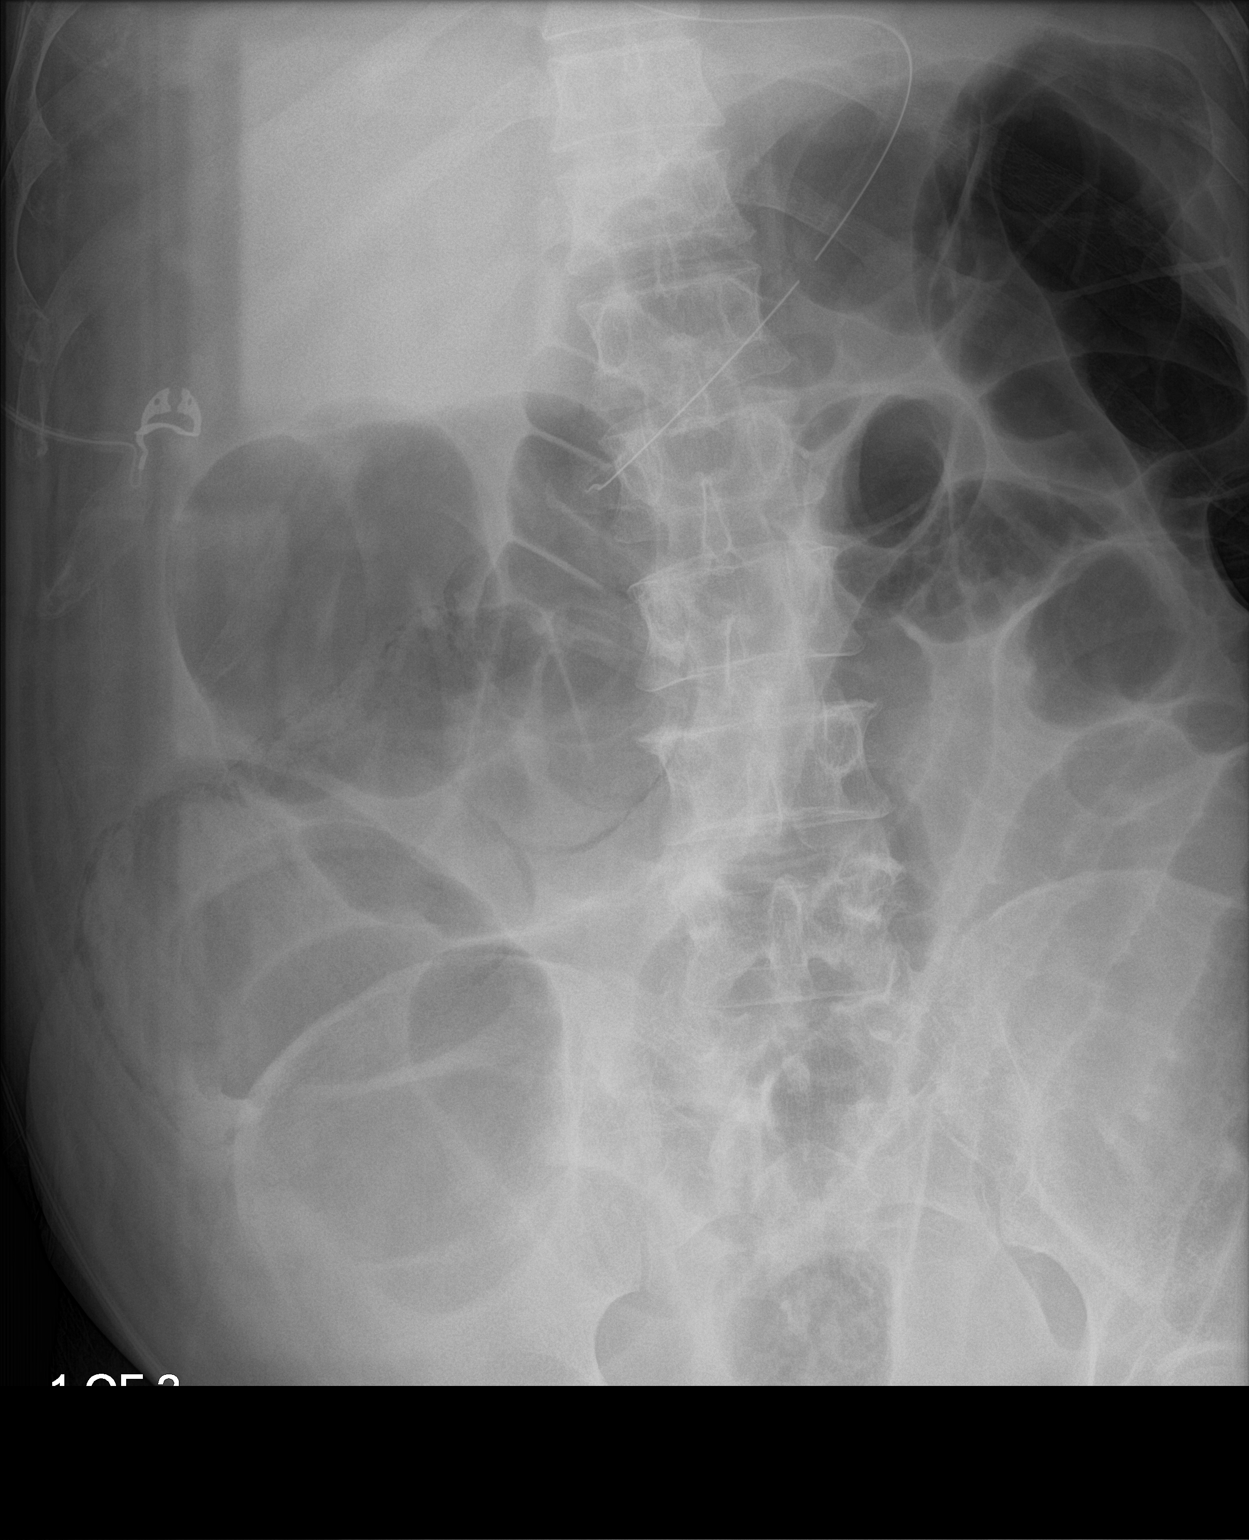

[abdomen kub (2 of 2)]
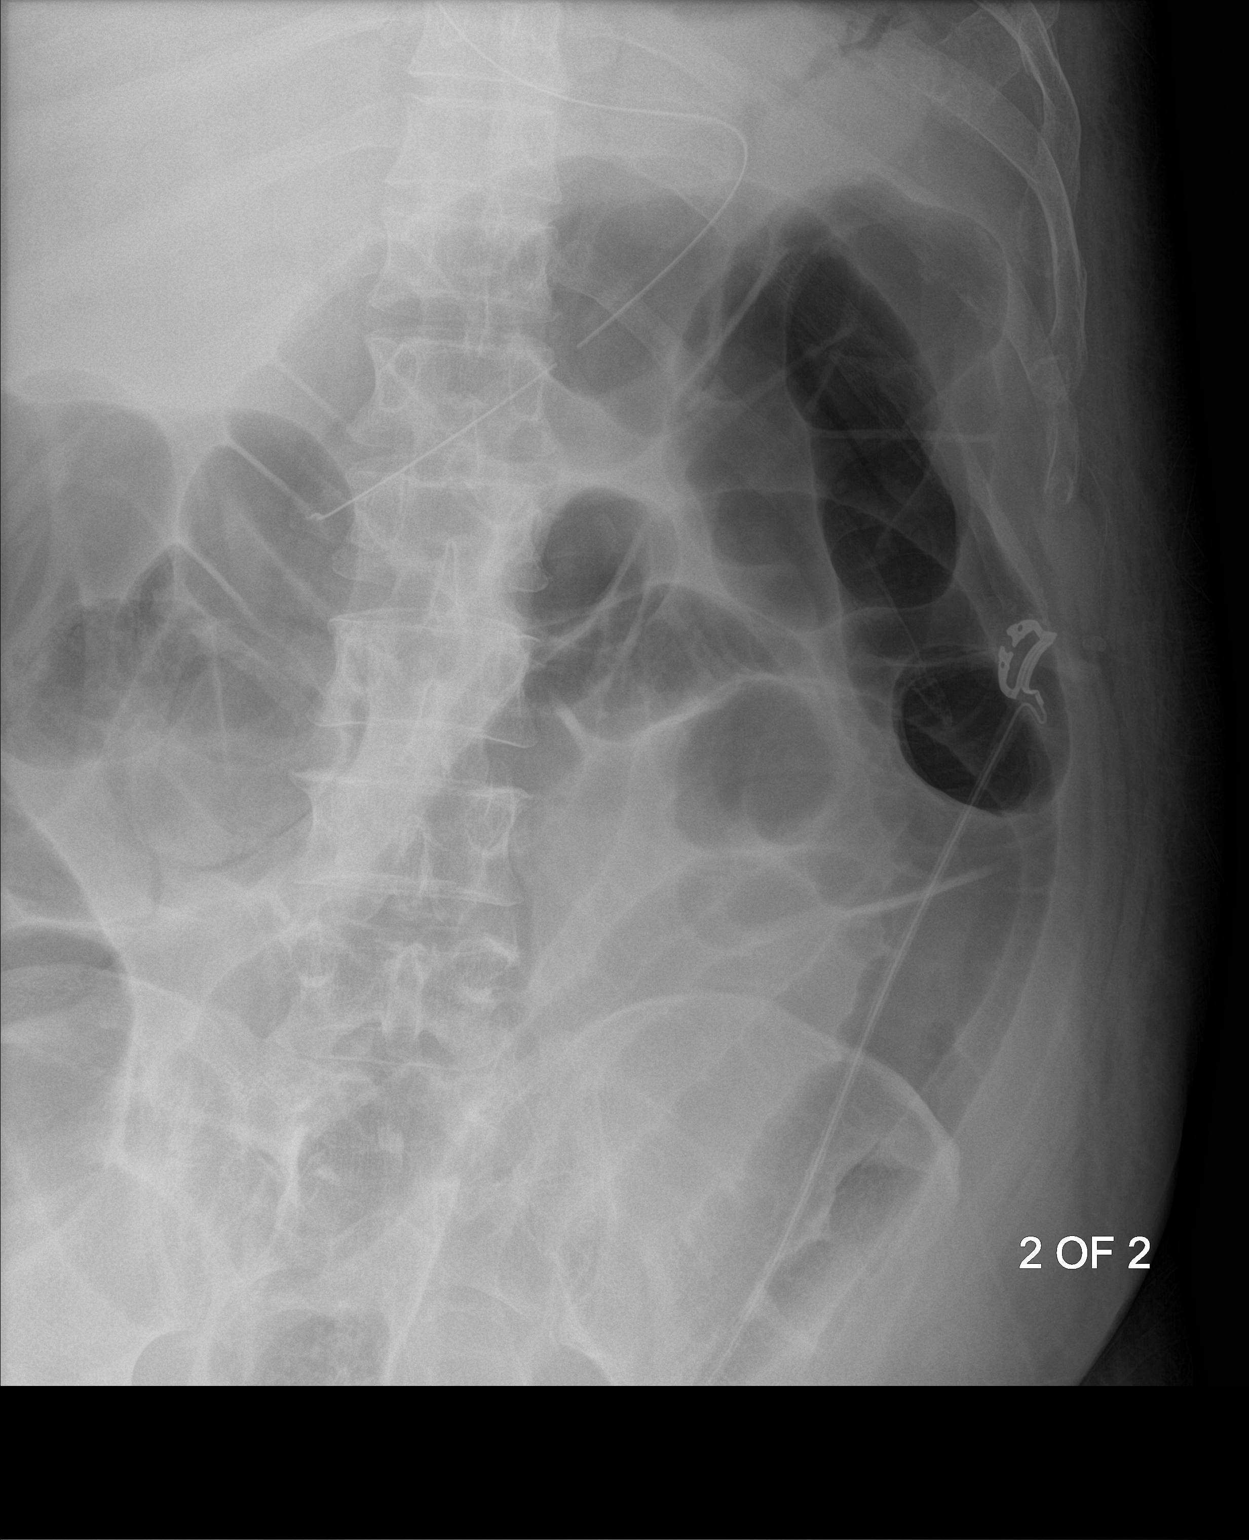

[2 of 2 positions shown; findings below may reference images not displayed]

FINDINGS: Nasogastric tube tip is at the gastric antrum. Diffuse gaseous
distension of the small intestine again seen. Large amount of gas
and fecal matter also present within the colon. Peripheral gas
distribution in the right colon could either be intraluminal or
indicate pneumatosis.
IMPRESSION: Increase gaseous distension of the small intestine diffusely despite
the presence of a nasogastric tube. Gas and fecal matter present
within the colon as well. Peripheral gas distribution pattern in the
right colon could be intraluminal or indicate pneumatosis. Consider
CT if there is clinical concern about the possibility of bowel
infarction.

## 2019-10-30 IMAGING — CT CT ABD-PELV W/O CM
2 of 4 series · 15 of 46 positions shown, 17 images · non-contrast
Comparison: 12/24/2017

CLINICAL DATA: Abdominal distention.  Jaundice.

EXAM:
CT ABDOMEN AND PELVIS WITHOUT CONTRAST
TECHNIQUE: Multidetector CT imaging of the abdomen and pelvis was performed
following the standard protocol without IV contrast.

[Series 2: routine abd/pel wo · axial · 0.86mm/px · z∈[-1231,-706]mm · 12 of 117 slices shown, 14 images]
[im 6/117  soft-tissue]
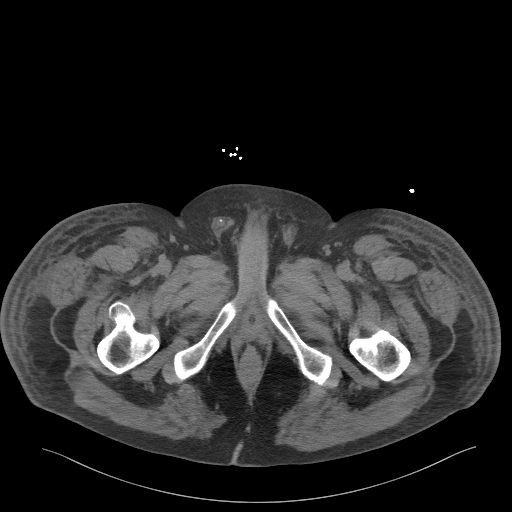
[im 6/117  bone]
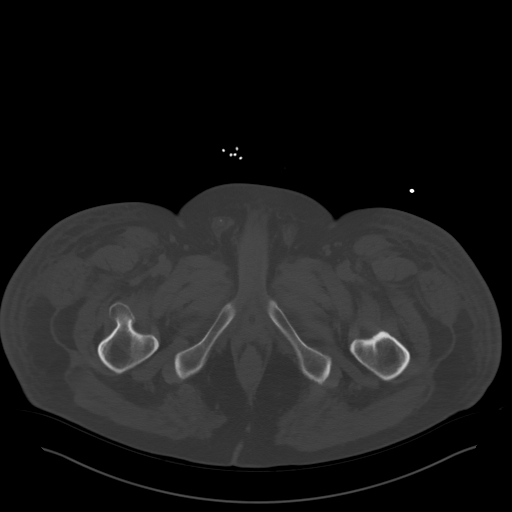
[im 16/117  soft-tissue]
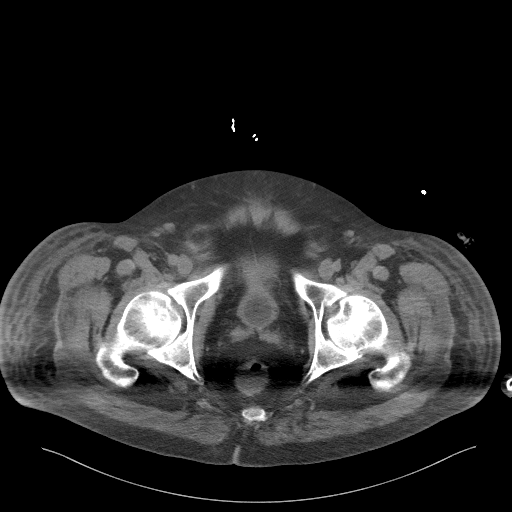
[im 27/117  soft-tissue]
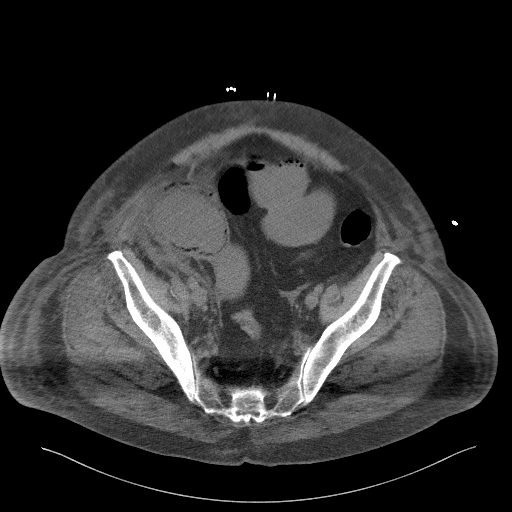
[im 37/117  soft-tissue]
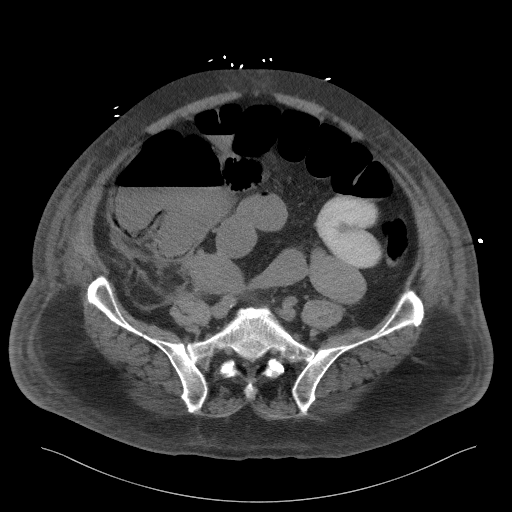
[im 43/117  soft-tissue]
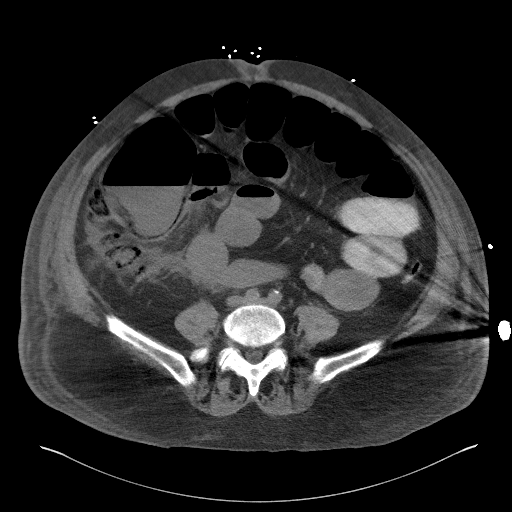
[im 53/117  soft-tissue]
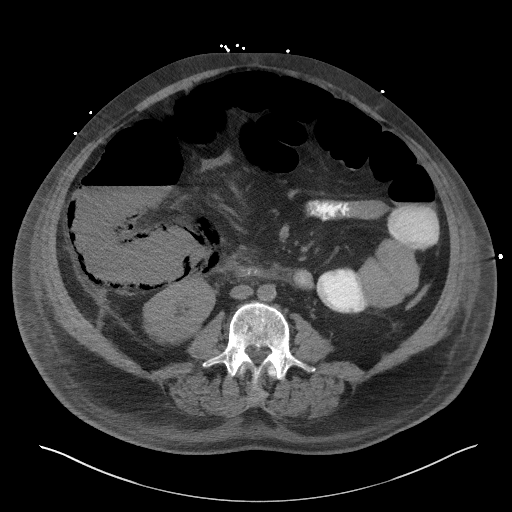
[im 64/117  soft-tissue]
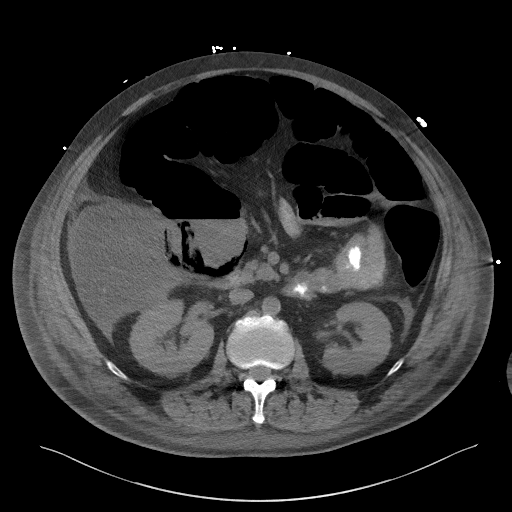
[im 74/117  soft-tissue]
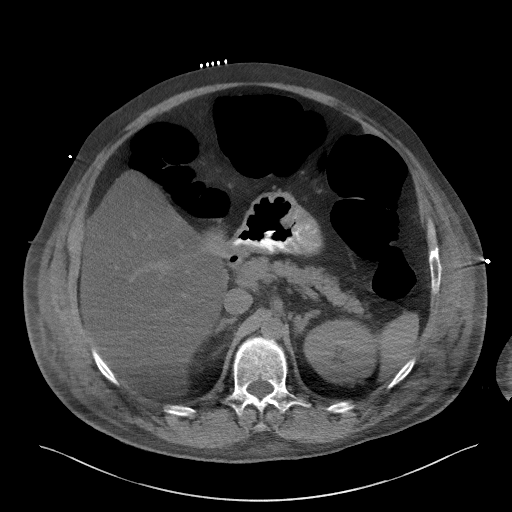
[im 80/117  soft-tissue]
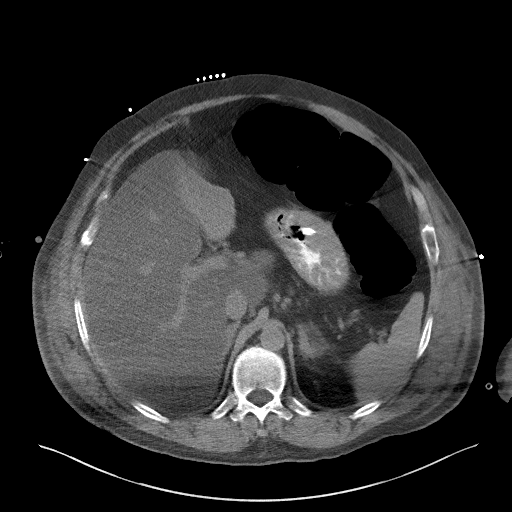
[im 80/117  bone]
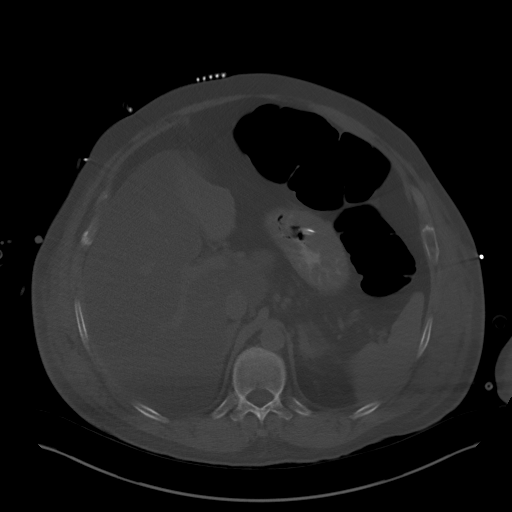
[im 90/117  soft-tissue]
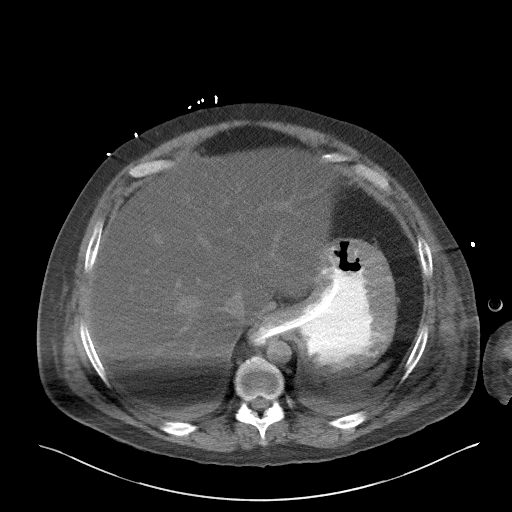
[im 101/117  soft-tissue]
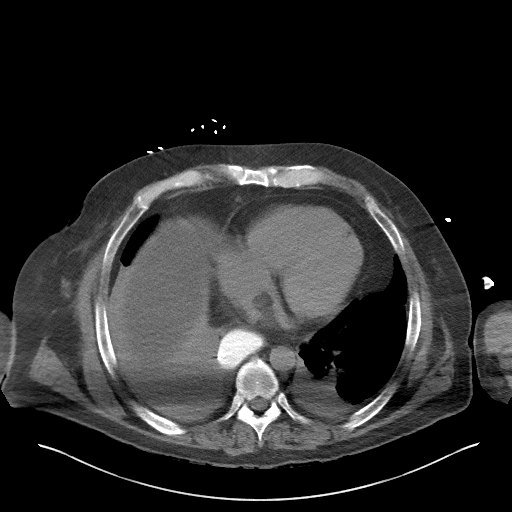
[im 111/117  soft-tissue]
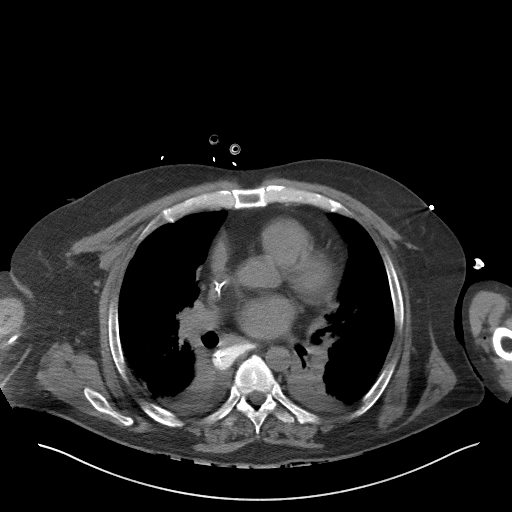

[Series 5: coronal st · coronal · 0.97mm/px · 3 of 123 slices shown]
[im 41/123  soft-tissue]
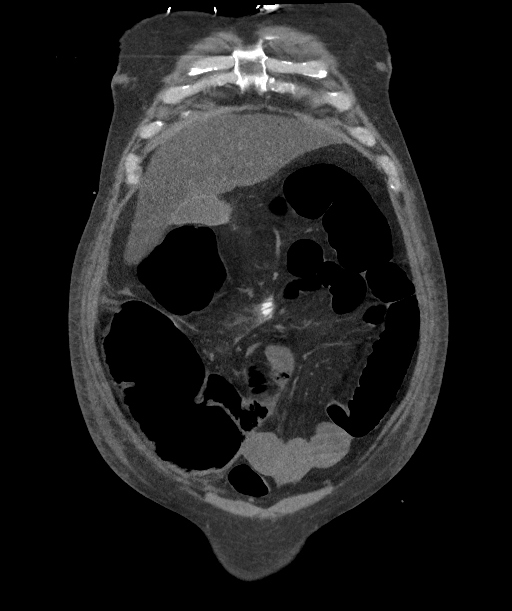
[im 55/123  soft-tissue]
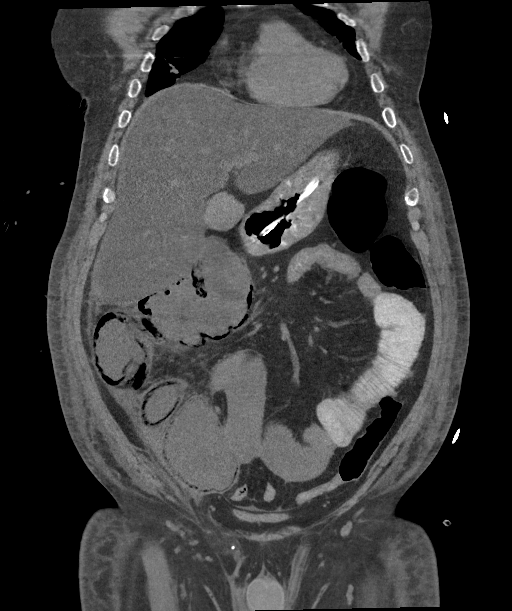
[im 68/123  soft-tissue]
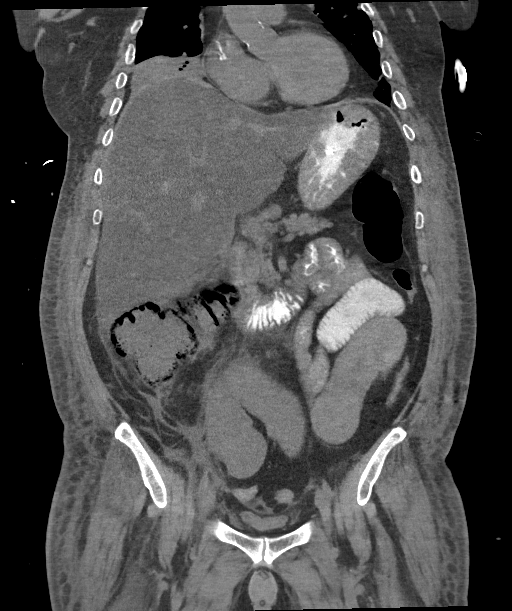

[15 of 46 positions shown; findings below may reference images not displayed]

FINDINGS: Lower chest: Heart is enlarged. Distal tip of a right PICC line is
positioned at the SVC/RA junction. Right lower lobe
collapse/consolidation is progressive in the interval. Small
bilateral pleural effusions are noted and there is new patchy
airspace disease in the left base.

Hepatobiliary: Liver measures 24.9 cm craniocaudal length. The liver
shows diffusely decreased attenuation suggesting steatosis. Probable
sludge within the gallbladder. No portal venous gas. No intrahepatic
or extrahepatic biliary dilation.

Pancreas: No focal mass lesion. No dilatation of the main duct. No
intraparenchymal cyst. No peripancreatic edema.

Spleen: No splenomegaly. No focal mass lesion.

Adrenals/Urinary Tract: No adrenal nodule or mass. Kidneys
unremarkable. No evidence for hydroureter. Foley catheter
decompresses the urinary bladder.

Stomach/Bowel: NG tube tip is in the distal stomach Duodenum is
normally positioned as is the ligament of Treitz. Small bowel is
mildly distended and fluid-filled measuring up to about 3.8 cm
diameter. Gas is identified within the wall the terminal ileum.
Interval development of circumferential pneumatosis in the cecum and
ascending colon up to about the level of the hepatic flexure. There
is associated edema/inflammation around the right colon with trace
fluid in the right paracolic gutter. Transverse colon is diffusely
gas filled without wall thickening or pneumatosis. Left colon is
decompressed and unremarkable by CT.

Vascular/Lymphatic: No abdominal aortic aneurysm. Mild
atherosclerotic calcification is noted in the abdominal aorta with
no substantial calcific plaque at the origin of the celiac axis,
SMA, or IMA. There is no gastrohepatic or hepatoduodenal ligament
lymphadenopathy. No intraperitoneal or retroperitoneal
lymphadenopathy. No pelvic sidewall lymphadenopathy.

Reproductive: The prostate gland and seminal vesicles have normal
imaging features.

Other: Trace intraperitoneal free fluid evident.

Musculoskeletal: Bone windows reveal no worrisome lytic or sclerotic
osseous lesions.
IMPRESSION: 1. Mild diffuse distention of small bowel with pneumatosis
identified in the terminal ileum and right colon. There is around
the right colon with some minimal fluid in the right paracolic
gutter. Imaging features highly concerning for bowel ischemia. No
evidence for portal venous gas.
2. Bilateral patchy areas of collapse/consolidation in both lungs,
progressive in the interval with small bilateral pleural effusions.
Imaging features suggest multifocal with steatosis.
3. Hepatomegaly with hepatic steatosis.

Critical Value/emergent results were called by me at the time of
interpretation on 12/27/2017 at [DATE] to Dr. OLIMPIA TIGER , who
verbally acknowledged these results.
